# Patient Record
Sex: Female | Born: 1955
Health system: Southern US, Community
[De-identification: ages and names within clinical notes are randomized; demographics above are authoritative.]

## PROBLEM LIST (undated history)

## (undated) DIAGNOSIS — R519 Headache, unspecified: Secondary | ICD-10-CM

## (undated) DIAGNOSIS — G4700 Insomnia, unspecified: Secondary | ICD-10-CM

## (undated) DIAGNOSIS — R208 Other disturbances of skin sensation: Secondary | ICD-10-CM

## (undated) DIAGNOSIS — M1990 Unspecified osteoarthritis, unspecified site: Secondary | ICD-10-CM

## (undated) HISTORY — DX: Headache, unspecified: R51.9

## (undated) HISTORY — PX: APPENDECTOMY: SHX54

## (undated) HISTORY — DX: Other disturbances of skin sensation: R20.8

## (undated) HISTORY — DX: Insomnia, unspecified: G47.00

## (undated) HISTORY — DX: Unspecified osteoarthritis, unspecified site: M19.90

---

## 1998-05-06 ENCOUNTER — Ambulatory Visit (HOSPITAL_COMMUNITY): Admission: RE | Admit: 1998-05-06 | Discharge: 1998-05-06 | Payer: Self-pay | Admitting: Obstetrics & Gynecology

## 1998-05-08 ENCOUNTER — Ambulatory Visit (HOSPITAL_COMMUNITY): Admission: RE | Admit: 1998-05-08 | Discharge: 1998-05-08 | Payer: Self-pay | Admitting: Obstetrics and Gynecology

## 1998-09-24 ENCOUNTER — Inpatient Hospital Stay (HOSPITAL_COMMUNITY): Admission: EM | Admit: 1998-09-24 | Discharge: 1998-09-25 | Payer: Self-pay | Admitting: Emergency Medicine

## 1998-12-05 ENCOUNTER — Ambulatory Visit (HOSPITAL_COMMUNITY): Admission: RE | Admit: 1998-12-05 | Discharge: 1998-12-05 | Payer: Self-pay | Admitting: Obstetrics and Gynecology

## 1998-12-05 ENCOUNTER — Encounter: Payer: Self-pay | Admitting: Obstetrics and Gynecology

## 1999-03-11 ENCOUNTER — Other Ambulatory Visit: Admission: RE | Admit: 1999-03-11 | Discharge: 1999-03-11 | Payer: Self-pay | Admitting: Obstetrics and Gynecology

## 1999-05-22 ENCOUNTER — Encounter: Payer: Self-pay | Admitting: Obstetrics and Gynecology

## 1999-05-22 ENCOUNTER — Ambulatory Visit (HOSPITAL_COMMUNITY): Admission: RE | Admit: 1999-05-22 | Discharge: 1999-05-22 | Payer: Self-pay | Admitting: Obstetrics and Gynecology

## 2000-04-15 ENCOUNTER — Other Ambulatory Visit: Admission: RE | Admit: 2000-04-15 | Discharge: 2000-04-15 | Payer: Self-pay | Admitting: Obstetrics and Gynecology

## 2000-05-24 ENCOUNTER — Encounter: Admission: RE | Admit: 2000-05-24 | Discharge: 2000-05-24 | Payer: Self-pay | Admitting: Obstetrics and Gynecology

## 2000-05-24 ENCOUNTER — Encounter: Payer: Self-pay | Admitting: Obstetrics and Gynecology

## 2000-11-09 ENCOUNTER — Encounter: Payer: Self-pay | Admitting: Sports Medicine

## 2000-11-09 ENCOUNTER — Ambulatory Visit (HOSPITAL_COMMUNITY): Admission: RE | Admit: 2000-11-09 | Discharge: 2000-11-09 | Payer: Self-pay | Admitting: Sports Medicine

## 2001-06-22 ENCOUNTER — Ambulatory Visit (HOSPITAL_COMMUNITY): Admission: RE | Admit: 2001-06-22 | Discharge: 2001-06-22 | Payer: Self-pay | Admitting: Obstetrics and Gynecology

## 2001-06-22 ENCOUNTER — Encounter: Payer: Self-pay | Admitting: Obstetrics and Gynecology

## 2001-12-14 ENCOUNTER — Other Ambulatory Visit: Admission: RE | Admit: 2001-12-14 | Discharge: 2001-12-14 | Payer: Self-pay | Admitting: *Deleted

## 2002-01-03 ENCOUNTER — Encounter: Admission: RE | Admit: 2002-01-03 | Discharge: 2002-01-03 | Payer: Self-pay | Admitting: Obstetrics and Gynecology

## 2003-01-01 ENCOUNTER — Ambulatory Visit (HOSPITAL_COMMUNITY): Admission: RE | Admit: 2003-01-01 | Discharge: 2003-01-01 | Payer: Self-pay | Admitting: Family Medicine

## 2003-01-01 ENCOUNTER — Encounter: Payer: Self-pay | Admitting: Family Medicine

## 2003-05-19 ENCOUNTER — Emergency Department (HOSPITAL_COMMUNITY): Admission: EM | Admit: 2003-05-19 | Discharge: 2003-05-19 | Payer: Self-pay

## 2004-02-03 ENCOUNTER — Ambulatory Visit (HOSPITAL_COMMUNITY): Admission: RE | Admit: 2004-02-03 | Discharge: 2004-02-03 | Payer: Self-pay | Admitting: Family Medicine

## 2006-11-21 ENCOUNTER — Ambulatory Visit (HOSPITAL_COMMUNITY): Admission: RE | Admit: 2006-11-21 | Discharge: 2006-11-21 | Payer: Self-pay | Admitting: Family Medicine

## 2010-06-02 ENCOUNTER — Ambulatory Visit (HOSPITAL_COMMUNITY): Admission: RE | Admit: 2010-06-02 | Discharge: 2010-06-02 | Payer: Self-pay | Admitting: Gastroenterology

## 2010-09-18 ENCOUNTER — Ambulatory Visit (HOSPITAL_COMMUNITY)
Admission: RE | Admit: 2010-09-18 | Discharge: 2010-09-18 | Payer: Self-pay | Source: Home / Self Care | Attending: Family Medicine | Admitting: Family Medicine

## 2015-03-31 ENCOUNTER — Ambulatory Visit (HOSPITAL_COMMUNITY)
Admission: RE | Admit: 2015-03-31 | Discharge: 2015-03-31 | Disposition: A | Discharge status: Home / Self Care | Payer: 59 | Source: Ambulatory Visit | Attending: Family Medicine | Admitting: Family Medicine

## 2015-03-31 ENCOUNTER — Other Ambulatory Visit (HOSPITAL_COMMUNITY): Payer: Self-pay | Admitting: Family Medicine

## 2015-03-31 DIAGNOSIS — Z1231 Encounter for screening mammogram for malignant neoplasm of breast: Secondary | ICD-10-CM

## 2015-04-15 ENCOUNTER — Other Ambulatory Visit (HOSPITAL_COMMUNITY): Payer: Self-pay | Admitting: Family Medicine

## 2015-04-15 DIAGNOSIS — R109 Unspecified abdominal pain: Secondary | ICD-10-CM

## 2015-04-22 ENCOUNTER — Ambulatory Visit (HOSPITAL_COMMUNITY): Payer: 59

## 2015-11-24 DIAGNOSIS — H524 Presbyopia: Secondary | ICD-10-CM | POA: Diagnosis not present

## 2015-11-24 DIAGNOSIS — H04123 Dry eye syndrome of bilateral lacrimal glands: Secondary | ICD-10-CM | POA: Diagnosis not present

## 2015-11-24 DIAGNOSIS — H52222 Regular astigmatism, left eye: Secondary | ICD-10-CM | POA: Diagnosis not present

## 2015-11-24 DIAGNOSIS — H2513 Age-related nuclear cataract, bilateral: Secondary | ICD-10-CM | POA: Diagnosis not present

## 2015-12-06 DIAGNOSIS — M180 Bilateral primary osteoarthritis of first carpometacarpal joints: Secondary | ICD-10-CM | POA: Diagnosis not present

## 2015-12-06 DIAGNOSIS — N951 Menopausal and female climacteric states: Secondary | ICD-10-CM | POA: Diagnosis not present

## 2015-12-06 DIAGNOSIS — N952 Postmenopausal atrophic vaginitis: Secondary | ICD-10-CM | POA: Diagnosis not present

## 2015-12-06 DIAGNOSIS — F5101 Primary insomnia: Secondary | ICD-10-CM | POA: Diagnosis not present

## 2015-12-08 MED FILL — ESTRACE 0.01% CREAM: 0.1 | 90 days supply | Qty: 43 | Fill #0

## 2015-12-09 MED FILL — ZOLPIDEM TARTRATE 5 MG TAB: 5 | 30 days supply | Qty: 30 | Fill #0

## 2015-12-25 MED FILL — valACYclovir HCL 1 GM TABS: 1 | 3 days supply | Qty: 12 | Fill #0 | Status: TO

## 2016-01-23 MED FILL — ZOLPIDEM TARTRATE 5 MG TAB: 5 | 30 days supply | Qty: 30 | Fill #1

## 2016-02-20 MED FILL — ZOLPIDEM TARTRATE 5 MG TAB: 5 | 30 days supply | Qty: 30 | Fill #2

## 2016-03-17 DIAGNOSIS — L237 Allergic contact dermatitis due to plants, except food: Secondary | ICD-10-CM | POA: Diagnosis not present

## 2016-03-17 DIAGNOSIS — G44019 Episodic cluster headache, not intractable: Secondary | ICD-10-CM | POA: Diagnosis not present

## 2016-03-17 DIAGNOSIS — F5101 Primary insomnia: Secondary | ICD-10-CM | POA: Diagnosis not present

## 2016-03-17 MED FILL — BETAMETHASONE DP 0.05% CRM: 0.05 | 30 days supply | Qty: 45 | Fill #0

## 2016-03-17 MED FILL — AMITRIPTYLINE HCL 25 MG TAB: 25 | 30 days supply | Qty: 30 | Fill #0

## 2016-04-19 MED FILL — ZOLPIDEM TARTRATE 5 MG TAB: 5 | 30 days supply | Qty: 30 | Fill #3

## 2016-06-11 DIAGNOSIS — Z0000 Encounter for general adult medical examination without abnormal findings: Secondary | ICD-10-CM | POA: Diagnosis not present

## 2016-06-14 ENCOUNTER — Other Ambulatory Visit: Payer: Self-pay | Admitting: Family Medicine

## 2016-06-14 DIAGNOSIS — Z1231 Encounter for screening mammogram for malignant neoplasm of breast: Secondary | ICD-10-CM

## 2016-06-21 ENCOUNTER — Ambulatory Visit
Admission: RE | Admit: 2016-06-21 | Discharge: 2016-06-21 | Disposition: A | Discharge status: Home / Self Care | Payer: 59 | Source: Ambulatory Visit | Attending: Family Medicine | Admitting: Family Medicine

## 2016-06-21 DIAGNOSIS — Z1231 Encounter for screening mammogram for malignant neoplasm of breast: Secondary | ICD-10-CM

## 2017-01-19 DIAGNOSIS — H5201 Hypermetropia, right eye: Secondary | ICD-10-CM | POA: Diagnosis not present

## 2017-07-13 DIAGNOSIS — F5101 Primary insomnia: Secondary | ICD-10-CM | POA: Diagnosis not present

## 2017-07-19 MED FILL — ZOLPIDEM TARTRATE 5 MG TAB: 5 | 30 days supply | Qty: 30 | Fill #0

## 2017-07-20 DIAGNOSIS — M9902 Segmental and somatic dysfunction of thoracic region: Secondary | ICD-10-CM | POA: Diagnosis not present

## 2017-07-20 DIAGNOSIS — M9901 Segmental and somatic dysfunction of cervical region: Secondary | ICD-10-CM | POA: Diagnosis not present

## 2017-07-20 DIAGNOSIS — M9904 Segmental and somatic dysfunction of sacral region: Secondary | ICD-10-CM | POA: Diagnosis not present

## 2017-07-20 DIAGNOSIS — M542 Cervicalgia: Secondary | ICD-10-CM | POA: Diagnosis not present

## 2017-10-03 MED FILL — ZOLPIDEM TARTRATE 5 MG TAB: 5 | 30 days supply | Qty: 30 | Fill #1

## 2017-11-03 ENCOUNTER — Other Ambulatory Visit: Payer: Self-pay | Admitting: Occupational Medicine

## 2017-11-03 ENCOUNTER — Ambulatory Visit: Payer: Self-pay

## 2017-11-03 DIAGNOSIS — R0781 Pleurodynia: Secondary | ICD-10-CM

## 2017-11-28 MED FILL — ZOLPIDEM TARTRATE 5 MG TAB: 5 | 30 days supply | Qty: 30 | Fill #2

## 2017-12-22 ENCOUNTER — Other Ambulatory Visit (HOSPITAL_BASED_OUTPATIENT_CLINIC_OR_DEPARTMENT_OTHER): Payer: Self-pay | Admitting: Family Medicine

## 2017-12-22 DIAGNOSIS — Z13820 Encounter for screening for osteoporosis: Secondary | ICD-10-CM

## 2017-12-30 ENCOUNTER — Other Ambulatory Visit (HOSPITAL_BASED_OUTPATIENT_CLINIC_OR_DEPARTMENT_OTHER): Payer: Self-pay

## 2018-01-02 ENCOUNTER — Other Ambulatory Visit (HOSPITAL_BASED_OUTPATIENT_CLINIC_OR_DEPARTMENT_OTHER): Payer: Self-pay

## 2018-01-04 DIAGNOSIS — Z0000 Encounter for general adult medical examination without abnormal findings: Secondary | ICD-10-CM | POA: Diagnosis not present

## 2018-01-11 ENCOUNTER — Other Ambulatory Visit (HOSPITAL_BASED_OUTPATIENT_CLINIC_OR_DEPARTMENT_OTHER): Payer: Self-pay | Admitting: Family Medicine

## 2018-01-11 ENCOUNTER — Encounter (INDEPENDENT_AMBULATORY_CARE_PROVIDER_SITE_OTHER): Payer: Self-pay

## 2018-01-11 ENCOUNTER — Ambulatory Visit (HOSPITAL_BASED_OUTPATIENT_CLINIC_OR_DEPARTMENT_OTHER)
Admission: RE | Admit: 2018-01-11 | Discharge: 2018-01-11 | Disposition: A | Discharge status: Home / Self Care | Payer: 59 | Source: Ambulatory Visit | Attending: Family Medicine | Admitting: Family Medicine

## 2018-01-11 DIAGNOSIS — Z1231 Encounter for screening mammogram for malignant neoplasm of breast: Secondary | ICD-10-CM

## 2018-01-11 DIAGNOSIS — M8588 Other specified disorders of bone density and structure, other site: Secondary | ICD-10-CM | POA: Diagnosis not present

## 2018-01-11 DIAGNOSIS — Z1159 Encounter for screening for other viral diseases: Secondary | ICD-10-CM | POA: Diagnosis not present

## 2018-01-11 DIAGNOSIS — M8589 Other specified disorders of bone density and structure, multiple sites: Secondary | ICD-10-CM | POA: Diagnosis not present

## 2018-01-11 DIAGNOSIS — Z13820 Encounter for screening for osteoporosis: Secondary | ICD-10-CM | POA: Insufficient documentation

## 2018-01-11 DIAGNOSIS — N951 Menopausal and female climacteric states: Secondary | ICD-10-CM | POA: Diagnosis not present

## 2018-01-11 DIAGNOSIS — F5101 Primary insomnia: Secondary | ICD-10-CM | POA: Diagnosis not present

## 2018-01-11 DIAGNOSIS — Z780 Asymptomatic menopausal state: Secondary | ICD-10-CM | POA: Diagnosis not present

## 2018-01-11 DIAGNOSIS — Z0000 Encounter for general adult medical examination without abnormal findings: Secondary | ICD-10-CM | POA: Diagnosis not present

## 2018-01-13 DIAGNOSIS — Z1159 Encounter for screening for other viral diseases: Secondary | ICD-10-CM | POA: Diagnosis not present

## 2018-01-23 MED FILL — ZOLPIDEM TARTRATE 5 MG TAB: 5 | 30 days supply | Qty: 30 | Fill #0

## 2018-01-23 MED FILL — ESTRADIOL 0.1 MG/GM CRM: 0.1 | 90 days supply | Qty: 43 | Fill #0

## 2018-03-01 MED FILL — ZOLPIDEM TARTRATE 5 MG TAB: 5 | 30 days supply | Qty: 30 | Fill #1

## 2018-03-30 DIAGNOSIS — E860 Dehydration: Secondary | ICD-10-CM | POA: Diagnosis not present

## 2018-03-30 DIAGNOSIS — K529 Noninfective gastroenteritis and colitis, unspecified: Secondary | ICD-10-CM | POA: Diagnosis not present

## 2018-03-30 DIAGNOSIS — R1900 Intra-abdominal and pelvic swelling, mass and lump, unspecified site: Secondary | ICD-10-CM | POA: Diagnosis not present

## 2018-03-31 ENCOUNTER — Other Ambulatory Visit (HOSPITAL_BASED_OUTPATIENT_CLINIC_OR_DEPARTMENT_OTHER): Payer: Self-pay | Admitting: Family Medicine

## 2018-03-31 DIAGNOSIS — R1900 Intra-abdominal and pelvic swelling, mass and lump, unspecified site: Secondary | ICD-10-CM

## 2018-04-02 ENCOUNTER — Ambulatory Visit (HOSPITAL_BASED_OUTPATIENT_CLINIC_OR_DEPARTMENT_OTHER)
Admission: RE | Admit: 2018-04-02 | Discharge: 2018-04-02 | Disposition: A | Discharge status: Home / Self Care | Payer: 59 | Source: Ambulatory Visit | Attending: Family Medicine | Admitting: Family Medicine

## 2018-04-02 DIAGNOSIS — R1900 Intra-abdominal and pelvic swelling, mass and lump, unspecified site: Secondary | ICD-10-CM

## 2018-04-07 DIAGNOSIS — L308 Other specified dermatitis: Secondary | ICD-10-CM | POA: Diagnosis not present

## 2018-04-07 DIAGNOSIS — L509 Urticaria, unspecified: Secondary | ICD-10-CM | POA: Diagnosis not present

## 2018-04-17 MED FILL — ZOLPIDEM TARTRATE 5 MG TAB: 5 | 30 days supply | Qty: 30 | Fill #2

## 2018-06-12 MED FILL — ZOLPIDEM TARTRATE 5 MG TAB: 5 | 30 days supply | Qty: 30 | Fill #3

## 2018-08-21 DIAGNOSIS — F5101 Primary insomnia: Secondary | ICD-10-CM | POA: Diagnosis not present

## 2018-08-21 DIAGNOSIS — J069 Acute upper respiratory infection, unspecified: Secondary | ICD-10-CM | POA: Diagnosis not present

## 2018-08-21 MED FILL — ZOLPIDEM TARTRATE 5 MG TAB: 5 | 30 days supply | Qty: 30 | Fill #0

## 2018-08-21 MED FILL — NAPROXEN 500 MG TABLET: 500 | 10 days supply | Qty: 20 | Fill #0

## 2018-08-21 MED FILL — BENZONATATE 100 MG CAPS: 100 | 7 days supply | Qty: 20 | Fill #0

## 2018-09-20 DIAGNOSIS — H5201 Hypermetropia, right eye: Secondary | ICD-10-CM | POA: Diagnosis not present

## 2018-09-20 DIAGNOSIS — H524 Presbyopia: Secondary | ICD-10-CM | POA: Diagnosis not present

## 2018-09-20 DIAGNOSIS — H2513 Age-related nuclear cataract, bilateral: Secondary | ICD-10-CM | POA: Diagnosis not present

## 2018-11-22 MED FILL — ZOLPIDEM TARTRATE 5 MG TAB: 5 | 30 days supply | Qty: 30 | Fill #2 | Status: TO

## 2018-12-04 MED FILL — ESTRADIOL 0.1 MG/GM CREA: 0.1 | 90 days supply | Qty: 43 | Fill #1

## 2019-01-10 MED FILL — ZOLPIDEM TARTRATE 5 MG TAB: 5 | 30 days supply | Qty: 30 | Fill #0

## 2019-01-16 MED FILL — NAPROXEN 500 MG TABLET: 500 | 10 days supply | Qty: 20 | Fill #0

## 2019-03-30 DIAGNOSIS — L638 Other alopecia areata: Secondary | ICD-10-CM | POA: Diagnosis not present

## 2019-03-30 MED FILL — BETAMETHASONE DP 0.05% LOT: 0.05 | 120 days supply | Qty: 120 | Fill #0

## 2019-04-23 DIAGNOSIS — F5101 Primary insomnia: Secondary | ICD-10-CM | POA: Diagnosis not present

## 2019-04-23 DIAGNOSIS — N951 Menopausal and female climacteric states: Secondary | ICD-10-CM | POA: Diagnosis not present

## 2019-04-24 MED FILL — ZOLPIDEM TARTRATE 5 MG TAB: 5 | 30 days supply | Qty: 30 | Fill #0

## 2019-04-24 MED FILL — ESTRADIOL 0.1 MG/GM CREA: 0.1 | 90 days supply | Qty: 43 | Fill #0

## 2019-07-05 MED FILL — ZOLPIDEM TARTRATE 5 MG TAB: 5 | 30 days supply | Qty: 30 | Fill #1

## 2019-08-18 DIAGNOSIS — J329 Chronic sinusitis, unspecified: Secondary | ICD-10-CM | POA: Diagnosis not present

## 2019-08-31 MED FILL — ZOLPIDEM TARTRATE 5 MG TAB: 5 | 30 days supply | Qty: 30 | Fill #2

## 2019-10-09 MED FILL — ZOLPIDEM TARTRATE 5 MG TAB: 5 | 30 days supply | Qty: 30 | Fill #3

## 2019-12-17 MED FILL — ZOLPIDEM TARTRATE 5 MG TAB: 5 | 30 days supply | Qty: 30 | Fill #0

## 2019-12-27 DIAGNOSIS — F5101 Primary insomnia: Secondary | ICD-10-CM | POA: Diagnosis not present

## 2019-12-27 DIAGNOSIS — N951 Menopausal and female climacteric states: Secondary | ICD-10-CM | POA: Diagnosis not present

## 2019-12-27 DIAGNOSIS — Z1231 Encounter for screening mammogram for malignant neoplasm of breast: Secondary | ICD-10-CM | POA: Diagnosis not present

## 2019-12-27 DIAGNOSIS — Z0000 Encounter for general adult medical examination without abnormal findings: Secondary | ICD-10-CM | POA: Diagnosis not present

## 2019-12-27 MED FILL — ESTRADIOL 0.1 MG/GM CREA: 0.1 | 90 days supply | Qty: 43 | Fill #0

## 2019-12-27 MED FILL — CITALOPRAM HBR 20 MG TABLET: 20 | 30 days supply | Qty: 30 | Fill #0

## 2019-12-27 MED FILL — NAPROXEN 500 MG TABLET: 500 | 10 days supply | Qty: 20 | Fill #0

## 2019-12-31 DIAGNOSIS — Z0000 Encounter for general adult medical examination without abnormal findings: Secondary | ICD-10-CM | POA: Diagnosis not present

## 2019-12-31 DIAGNOSIS — E538 Deficiency of other specified B group vitamins: Secondary | ICD-10-CM | POA: Diagnosis not present

## 2019-12-31 DIAGNOSIS — N951 Menopausal and female climacteric states: Secondary | ICD-10-CM | POA: Diagnosis not present

## 2020-01-02 DIAGNOSIS — R208 Other disturbances of skin sensation: Secondary | ICD-10-CM | POA: Diagnosis not present

## 2020-01-02 MED FILL — valACYclovir HCL 1 GM TABS: 1 | 7 days supply | Qty: 21 | Fill #0

## 2020-01-02 MED FILL — GABAPENTIN 100 MG CAPSULE: 100 | 7 days supply | Qty: 21 | Fill #0

## 2020-01-08 ENCOUNTER — Encounter: Payer: Self-pay | Admitting: Neurology

## 2020-01-08 ENCOUNTER — Ambulatory Visit: Payer: 59 | Admitting: Neurology

## 2020-01-08 ENCOUNTER — Other Ambulatory Visit: Payer: Self-pay | Admitting: Neurology

## 2020-01-08 ENCOUNTER — Other Ambulatory Visit: Payer: Self-pay | Admitting: Orthopedic Surgery

## 2020-01-08 ENCOUNTER — Other Ambulatory Visit: Payer: Self-pay

## 2020-01-08 VITALS — BP 124/88 | HR 85 | Temp 96.4°F | Ht 62.0 in | Wt 105.0 lb

## 2020-01-08 DIAGNOSIS — R202 Paresthesia of skin: Secondary | ICD-10-CM

## 2020-01-08 DIAGNOSIS — G35 Multiple sclerosis: Secondary | ICD-10-CM

## 2020-01-08 MED ORDER — DICLOFENAC SODIUM 1 % EX GEL: 4.0000 g | Freq: Four times a day (QID) | CUTANEOUS | 11 refills | Status: DC | PRN

## 2020-01-08 MED ORDER — LIDOCAINE-PRILOCAINE 2.5-2.5 % EX CREA: 1.0000 "application " | TOPICAL_CREAM | Freq: Four times a day (QID) | CUTANEOUS | 11 refills | Status: DC | PRN

## 2020-01-08 MED FILL — DICLOFENAC SODIUM 1 % GEL: 1 | 7 days supply | Qty: 100 | Fill #0

## 2020-01-08 MED FILL — LIDOCAINE-PRILOCAINE CREAM: 2.5-2.5 | 14 days supply | Qty: 30 | Fill #0

## 2020-01-08 NOTE — Progress Notes (Signed)
PATIENT: Destiny Dunlap DOB: Feb 09, 1956  Chief Complaint  Patient presents with  . Dysesthesia    She is here with her husband, Richard. She has a pending MRI brain on 01/14/20. Reports on 12/28/19, she developed a burning sensation in her right antecubital area. Symptoms have now spread to various areas of her body (left arm, behind knees, shoulder blades, jaw line, hands). She is taking gabapentin 100mg  one capsule TID (started on 01/05/20). No improvement in symptoms yet.   Marland Kitchen Headache    She has headaches at least once every three weeks. She uses Excedrin Migraine helps. About twice per year, she misses work with a headache severe enough to cause vomiting and requires rest in a dark room.   . PCP    Leota Jacobsen, MD     HISTORICAL  NERINE STURMS is a 64 year old female, seen in request by primary care physician Dr. Andria Rhein, Mortimer Fries accompanied by her husband evaluation for right arm paresthesia  I have reviewed and summarized the referring note from the referring physician.  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, denies arm weakness, she was also able to take care of her grandchildren age of 27 months, 63 years old on January 01, 2020, denies difficulty holding them,  She was seen by her primary care physician on January 02, 2020, was treated with gabapentin 100 mg 3 times a day, the medicine initially was helping her, but lost its benefit quickly, she also complains of significant dry mouth even with low-dose  In addition, her right arm paresthesia has progressed to burning pain, 9 out of 10, constant, also spreading to involving left arm, bilateral calf region  If she is distracted, stayed in cold environment such as OR, she is able to perform her job, is  become more noticeable the other time  She denies gait abnormality, no significant neck, low back pain, she denied rash broke out  MRI brain was ordered already Laboratory evaluation at [facility] March 2021, hemoglobin globin 14.6, CMP, vitamin D was 27, negative hepatitis C,  REVIEW OF SYSTEMS: Full 14 system review of systems performed and notable only for as above All other review of systems were negative.  ALLERGIES: Allergies  Allergen Reactions  . Iodine Rash    HOME MEDICATIONS: Current Outpatient Medications  Medication Sig Dispense Refill  . aspirin-acetaminophen-caffeine (EXCEDRIN MIGRAINE) 250-250-65 MG tablet Take 1 tablet by mouth as needed for headache.    . citalopram (CELEXA) 20 MG tablet Take 20 mg by mouth daily.    Marland Kitchen estradiol (ESTRACE) 0.1 MG/GM vaginal cream Place 1 g vaginally 3 (three) times a week.    . gabapentin (NEURONTIN) 100 MG capsule Take 100 mg by mouth 3 (three) times daily.    . TURMERIC PO Take 300 mg by mouth daily.    Marland Kitchen VITAMIN D PO Take 10,000 Units by mouth daily.    Marland Kitchen zolpidem (AMBIEN) 5 MG tablet Take 2.5 mg by mouth at bedtime as needed for sleep. Estimates taking four times weekly.     No current facility-administered medications for this visit.    PAST MEDICAL HISTORY: Past Medical History:  Diagnosis Date  . Arthritis   . Burning sensation   .  Frequent headaches   . Insomnia     PAST SURGICAL HISTORY: Past Surgical History:  Procedure Laterality Date  . APPENDECTOMY      FAMILY HISTORY: Family History  Problem Relation Age of Onset  . Heart disease Mother   . Parkinson's disease Father   . CAD Father   . Thrombocytopenia Father   . Seizures Father        2 grand mal seizures    SOCIAL HISTORY: Social History   Socioeconomic History  . Marital status: Married    Spouse name: Not on file  . Number of children: 2  . Years of education: college  . Highest education level: Not on file  Occupational History   . Not on file  Tobacco Use  . Smoking status: Never Smoker  . Smokeless tobacco: Never Used  Substance and Sexual Activity  . Alcohol use: Never  . Drug use: Never  . Sexual activity: Not on file  Other Topics Concern  . Not on file  Social History Narrative   Lives at home with her husband.   One cup caffeine per day.   Right-handed.   Social Determinants of Health   Financial Resource Strain:   . Difficulty of Paying Living Expenses:   Food Insecurity:   . Worried About Charity fundraiser in the Last Year:   . Arboriculturist in the Last Year:   Transportation Needs:   . Film/video editor (Medical):   Marland Kitchen Lack of Transportation (Non-Medical):   Physical Activity:   . Days of Exercise per Week:   . Minutes of Exercise per Session:   Stress:   . Feeling of Stress :   Social Connections:   . Frequency of Communication with Friends and Family:   . Frequency of Social Gatherings with Friends and Family:   . Attends Religious Services:   . Active Member of Clubs or Organizations:   . Attends Archivist Meetings:   Marland Kitchen Marital Status:   Intimate Partner Violence:   . Fear of Current or Ex-Partner:   . Emotionally Abused:   Marland Kitchen Physically Abused:   . Sexually Abused:      PHYSICAL EXAM   Vitals:   01/08/20 1447  BP: 124/88  Pulse: 85  Temp: (!) 96.4 F (35.8 C)  Weight: 105 lb (47.6 kg)  Height: 5\' 2"  (1.575 m)    Not recorded      Body mass index is 19.2 kg/m.  PHYSICAL EXAMNIATION:  Gen: NAD, conversant, well nourised, well groomed                     Cardiovascular: Regular rate rhythm, no peripheral edema, warm, nontender. Eyes: Conjunctivae clear without exudates or hemorrhage Neck: Supple, no carotid bruits. Pulmonary: Clear to auscultation bilaterally   NEUROLOGICAL EXAM:  MENTAL STATUS: Speech:    Speech is normal; fluent and spontaneous with normal comprehension.  Cognition:     Orientation to time, place and person      Normal recent and remote memory     Normal Attention span and concentration     Normal Language, naming, repeating,spontaneous speech     Fund of knowledge   CRANIAL NERVES: CN II: Visual fields are full to confrontation. Pupils are round equal and briskly reactive to light. CN III, IV, VI: extraocular movement are normal. No ptosis. CN V: Facial sensation is intact to light touch CN VII: Face is symmetric with normal eye closure  CN VIII: Hearing is normal to causal conversation. CN IX, X: Phonation is normal. CN XI: Head turning and shoulder shrug are intact  MOTOR: There is no pronator drift of out-stretched arms. Muscle bulk and tone are normal. Muscle strength is normal.  REFLEXES: Reflexes are 2+ and symmetric at the biceps, triceps, knees, and ankles. Plantar responses are flexor.  SENSORY: Varies, likely sensitivity of right antecubital area  COORDINATION: There is no trunk or limb dysmetria noted.  GAIT/STANCE: Posture is normal. Gait is steady with normal steps, base, arm swing, and turning. Heel and toe walking are normal. Tandem gait is normal.  Romberg is absent.   DIAGNOSTIC DATA (LABS, IMAGING, TESTING) - I reviewed patient records, labs, notes, testing and imaging myself where available.   ASSESSMENT AND PLAN  YMELDA SAMONTE is a 64 y.o. female   Right arm paresthesia since December 28, 2019, also involving left arm, bilateral lower extremity now  Essentially normal neurological examination with exception of mild hypersensitivity of the right antecubital region  She desires complete evaluation, will also add on MRI of cervical spine to rule out cervical myelopathy  EMG nerve conduction study to rule out right upper extremity neuropathy  Laboratory evaluations,  EMLA cream with diclofenac gel as needed   Marcial Pacas, M.D. Ph.D.  [facility] [address] [place], [place] [address] Ph: [phone] Fax: [phone]  CC:  Leota Jacobsen, MD

## 2020-01-09 ENCOUNTER — Telehealth: Payer: Self-pay | Admitting: Neurology

## 2020-01-09 LAB — VARICELLA ZOSTER ABS, IGG/IGM
Varicella IgM: <0.91 index (ref 0.00–0.90)
Varicella zoster IgG: 404 index (ref >165)

## 2020-01-09 LAB — SEDIMENTATION RATE: Sed Rate: 2 mm/hr (ref 0–40)

## 2020-01-09 LAB — ANA W/REFLEX IF POSITIVE: Anti Nuclear Antibody (ANA): NEGATIVE

## 2020-01-09 LAB — C-REACTIVE PROTEIN: CRP: <1 mg/L (ref 0–10)

## 2020-01-09 NOTE — Telephone Encounter (Signed)
Pt has called to report that her symptoms are worsening and pt would like to request for blood to be tested for lead poisoning, pt is asking if the blood from yesterday could be used.

## 2020-01-09 NOTE — Telephone Encounter (Signed)
Pt has called back to report that she would like her blood checked for Carbon monoxide because she fell asleep near gas logs.  Pt states she feels worse today, pt asked that it be noted she told her husband if she continues to feel bad she will likely go back to the ED.  Pt was told message is being sent to Sharyn Lull, [place] and that she will have Dr Krista Blue to address the patient will wait for a response.

## 2020-01-09 NOTE — Telephone Encounter (Signed)
The diagnosis of CO poisoning is based upon a compatible history and physical examination in conjunction with an elevated carboxyhemoglobin level measured by cooximetry of an arterial blood gas sample. In hemodynamically stable patients, venous samples are accurate and commonly used [46,47]. Nonsmokers may have up to 3 percent carboxyhemoglobin at baseline; smokers may have levels of 10 to 15 percent. Levels above these respective values are consistent with CO poisoning.  A carboxyhemoglobin measurement is essential for determining exposure, but levels correlate imprecisely with the degree of poisoning and are not predictive of delayed neurologic sequelae (DNS). Patient symptoms and signs guide management, not carboxyhemoglobin levels.  Many patients can be managed in the emergency department, as most symptoms resolve with high-flow oxygen. Patients whose symptoms do not resolve, who demonstrate electrocardiogram (ECG) or laboratory evidence of severe poisoning, or who have other medical or social cause for concern should be hospitalized.   Many acute care hospitals lack the ability to measure carboxyhemoglobin concentrations.  Patients suspected of CO poisoning who are treated in hospitals without on-site cooximetry should be treated with 100 percent oxygen via nonrebreathing face mask.  Please call patient, our office does not not have the ability to test the carbon monoxide poisoning, if she is suspicious for carbon monoxide poisoning, she should go to emergency room as soon as possible

## 2020-01-09 NOTE — Telephone Encounter (Signed)
Late entry. I spoke to the patient and her husband earlier. They both sit next to the same gas logs. He says they have a carbon monoxide alarm system that is in working order. I informed them of Dr. Rhea Belton response below about going to the ED for evaluation. She stated she did not believe this was the issue and that she was just searching for possible answers. Stated that she researched the symptoms of CO poison and does not have any of them. She denies any nausea, vomiting or dizziness. He denies feeling bad at all.  Dr. Krista Blue was willing to add heavy metals to her lab work. However, [facility] is unable to add it to the blood collected yesterday. She would have to come back again. She declined the offer and said she plans to see her PCP for the orders.

## 2020-01-10 ENCOUNTER — Telehealth: Payer: Self-pay | Admitting: Neurology

## 2020-01-10 DIAGNOSIS — R5383 Other fatigue: Secondary | ICD-10-CM | POA: Diagnosis not present

## 2020-01-10 DIAGNOSIS — Z79899 Other long term (current) drug therapy: Secondary | ICD-10-CM | POA: Diagnosis not present

## 2020-01-10 DIAGNOSIS — M542 Cervicalgia: Secondary | ICD-10-CM | POA: Diagnosis not present

## 2020-01-10 DIAGNOSIS — R208 Other disturbances of skin sensation: Secondary | ICD-10-CM | POA: Diagnosis not present

## 2020-01-10 DIAGNOSIS — R634 Abnormal weight loss: Secondary | ICD-10-CM | POA: Diagnosis not present

## 2020-01-10 DIAGNOSIS — R000 Tachycardia, unspecified: Secondary | ICD-10-CM | POA: Diagnosis not present

## 2020-01-10 DIAGNOSIS — R531 Weakness: Secondary | ICD-10-CM | POA: Diagnosis not present

## 2020-01-10 DIAGNOSIS — Z20822 Contact with and (suspected) exposure to covid-19: Secondary | ICD-10-CM | POA: Diagnosis not present

## 2020-01-10 DIAGNOSIS — M545 Low back pain: Secondary | ICD-10-CM | POA: Diagnosis not present

## 2020-01-10 NOTE — Telephone Encounter (Signed)
[facility] Auth: [place] Ref # [id number]. I spoke to the patient to schedule her MRI Cervical spine. She stated she doesn't think she needs the c-spine done and that she is checking in at [facility] ER right now because she is worsening daily. She stated she is hoping to get some answers today at [facility].

## 2020-01-11 ENCOUNTER — Telehealth: Payer: Self-pay | Admitting: Neurology

## 2020-01-11 ENCOUNTER — Other Ambulatory Visit (HOSPITAL_COMMUNITY): Payer: Self-pay

## 2020-01-11 NOTE — Telephone Encounter (Signed)
Pt called wanting to know if her MRI of the brain can be changed to an MRI of the Cervical Spine. Please advise.

## 2020-01-11 NOTE — Telephone Encounter (Signed)
Returned the call to the patient. Dr. Krista Blue placed an order for MRI cervical the day of her visit. Provided our office number and requested she call Destiny Dunlap back during our regular office hours. She will be happy to help get this scheduled.  MRI brain was ordered by Dr. Altamese Shoreham.

## 2020-01-14 ENCOUNTER — Ambulatory Visit (HOSPITAL_COMMUNITY)
Admission: RE | Admit: 2020-01-14 | Discharge: 2020-01-14 | Disposition: A | Discharge status: Home / Self Care | Payer: 59 | Source: Ambulatory Visit | Attending: Orthopedic Surgery | Admitting: Orthopedic Surgery

## 2020-01-14 ENCOUNTER — Other Ambulatory Visit: Payer: Self-pay

## 2020-01-14 DIAGNOSIS — G35 Multiple sclerosis: Secondary | ICD-10-CM | POA: Insufficient documentation

## 2020-01-14 MED ORDER — GADOBUTROL 1 MMOL/ML IV SOLN
4.7000 mL | Freq: Once | INTRAVENOUS | Status: AC | PRN
  Administered 2020-01-14: 12:00:00 4.7 mL via INTRAVENOUS

## 2020-01-14 NOTE — Telephone Encounter (Signed)
no to the covid questions MR Cervical spine wo contrast Dr. Edmund Hilda [facility] Auth: [place] Ref # [id number]. The patient is scheduled at [facility] for 01/23/20.

## 2020-01-14 NOTE — Telephone Encounter (Signed)
no to the covid questions MR Cervical spine wo contrast Dr. Edmund Hilda [facility] Auth: [place] Ref # [id number]. The patient is scheduled at [facility] for 01/23/20.

## 2020-01-15 ENCOUNTER — Telehealth: Payer: Self-pay | Admitting: Neurology

## 2020-01-15 NOTE — Telephone Encounter (Signed)
She completed MRI brain on 01/14/20.

## 2020-01-15 NOTE — Telephone Encounter (Signed)
I reviewed the results below. She would like an additional call from Dr. Krista Blue to discuss in further detail. She is still worried that she has MS, despite the findings on the scan.

## 2020-01-15 NOTE — Telephone Encounter (Addendum)
IMPRESSION: Nonspecific scattered left frontal white matter lesions.  No enhancing lesions or restricted diffusion.  Normal bilateral optic nerves.  I have called patient, I have personally reviewed MRI of the brain with without contrast from January 14, 2020, minimum scattered T2/flair hyperintensity periventricular and subcortical white man matter foci involving left frontal lobe, this can be age-related changes,  Essentially no significant abnormality on MRI of the brain, no evidence of MS.  MRI cervical is pending for April 21.

## 2020-01-15 NOTE — Telephone Encounter (Signed)
Pt called wanting to discuss her MRI results. Please advise. 

## 2020-01-15 NOTE — Telephone Encounter (Signed)
Message from patient:  Destiny Dunlap, Destiny Dunlap "[name]"  Marcial Pacas, MD 12 minutes ago (9:42 AM)   Please call me ASAP to explain the MRI results from yesterday. Thanks [phone]

## 2020-01-16 DIAGNOSIS — R208 Other disturbances of skin sensation: Secondary | ICD-10-CM | POA: Diagnosis not present

## 2020-01-16 DIAGNOSIS — E559 Vitamin D deficiency, unspecified: Secondary | ICD-10-CM | POA: Diagnosis not present

## 2020-01-17 MED FILL — GABAPENTIN 100 MG CAPSULE: 100 | 30 days supply | Qty: 180 | Fill #0

## 2020-01-18 DIAGNOSIS — E538 Deficiency of other specified B group vitamins: Secondary | ICD-10-CM | POA: Diagnosis not present

## 2020-01-18 DIAGNOSIS — R202 Paresthesia of skin: Secondary | ICD-10-CM | POA: Diagnosis not present

## 2020-01-21 MED FILL — LIDOCAINE-PRILOCAINE CREAM: 2.5-2.5 | 14 days supply | Qty: 30 | Fill #1

## 2020-01-23 ENCOUNTER — Ambulatory Visit: Payer: 59

## 2020-01-23 ENCOUNTER — Telehealth: Payer: Self-pay | Admitting: Neurology

## 2020-01-23 DIAGNOSIS — R202 Paresthesia of skin: Secondary | ICD-10-CM | POA: Diagnosis not present

## 2020-01-23 NOTE — Telephone Encounter (Signed)
I called patient today regarding rescheduling 5/3 NCV/EMG due to tech being out. Patient states she did not wish to reschedule test until she talks with her pcp to see if she needs it or if she can put it off.

## 2020-01-24 ENCOUNTER — Telehealth: Payer: Self-pay | Admitting: Neurology

## 2020-01-24 NOTE — Telephone Encounter (Signed)
I have spoken with the patient and provided her with the results below. She was scheduled on 02/04/20 but our office canceled due to Elmyra Ricks being out of the office that day. She would like to call back to reschedule when she has her calendar available.

## 2020-01-24 NOTE — Telephone Encounter (Signed)
IMPRESSION:   MRI cervical spine (without) demonstrating:  - At C3-4: disc bulging and uncovertebral joint hypertrophy with severe right foraminal stenosis. - At C5-6, C6-7: uncovertebral joint hypertrophy with mild right foraminal stenosis. - No intrinsic or compressive spinal cord lesions.   Please call patient, MRI of cervical spine showed evidence of degenerative changes, most severe at C3-4, with disc bulging, and uncovertebral joint hypertrophy, with severe right foraminal stenosis, no evidence or cord signal change, or cord compression,  I have ordered EMG nerve conduction study during previous visit, if she wish to proceed, please give her appointment

## 2020-02-04 ENCOUNTER — Encounter: Payer: 59 | Admitting: Neurology

## 2020-02-07 ENCOUNTER — Telehealth: Payer: Self-pay | Admitting: Neurology

## 2020-02-07 NOTE — Telephone Encounter (Signed)
I called the patient had a long convo and advised we would call her if a EMG spot came available before the 19th.

## 2020-02-07 NOTE — Telephone Encounter (Signed)
Patient called requesting a cb from [place] did not wish to disclose in regards to what.

## 2020-02-07 NOTE — Telephone Encounter (Signed)
I called patient and left a voice message for her to return my call

## 2020-02-11 NOTE — Telephone Encounter (Signed)
I called pt. Spoke to [place], husband, per [employer]. I offered pt an appt on 02/13/20 at 10:30/11:15am for NCV/EMG testing. Richard accepted this appt. Pt's husband verbalized understanding of new appt date and time and that the appt on 02/20/20 will be cancelled.

## 2020-02-11 NOTE — Telephone Encounter (Signed)
There currently no sooner appts for EMG/NCV testing with Dr. Krista Blue. I will continue to watch for cancellations.

## 2020-02-13 ENCOUNTER — Ambulatory Visit: Payer: 59 | Admitting: Neurology

## 2020-02-13 ENCOUNTER — Telehealth: Payer: Self-pay | Admitting: Neurology

## 2020-02-13 ENCOUNTER — Other Ambulatory Visit: Payer: Self-pay

## 2020-02-13 ENCOUNTER — Ambulatory Visit (INDEPENDENT_AMBULATORY_CARE_PROVIDER_SITE_OTHER): Payer: 59 | Admitting: Neurology

## 2020-02-13 DIAGNOSIS — R202 Paresthesia of skin: Secondary | ICD-10-CM | POA: Diagnosis not present

## 2020-02-13 DIAGNOSIS — R52 Pain, unspecified: Secondary | ICD-10-CM | POA: Diagnosis not present

## 2020-02-13 MED ORDER — DULOXETINE HCL 20 MG PO CPEP: 20.0000 mg | ORAL_CAPSULE | Freq: Every day | ORAL | 3 refills | Status: DC

## 2020-02-13 NOTE — Telephone Encounter (Signed)
Pt called stating that her pharmacy informed her that she should stop her DULoxetine (CYMBALTA) 20 MG capsule and to call the office. Pt would like to speak to RN about this. Please advise.

## 2020-02-13 NOTE — Progress Notes (Signed)
PATIENT: Destiny Dunlap DOB: [birth year]  No chief complaint on file.    HISTORICAL  Destiny Dunlap is a 64 year old female, seen in request by primary care physician Dr. Andria Rhein, Mortimer Fries accompanied by her husband evaluation for right arm paresthesia  I have reviewed and summarized the referring note from the referring physician.  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, denies arm weakness, she was also able to take care of her grandchildren age of 38 months, 55 years old on January 01, 2020, denies difficulty holding them,  She was seen by her primary care physician on January 02, 2020, was treated with gabapentin 100 mg 3 times a day, the medicine initially was helping her, but lost its benefit quickly, she also complains of significant dry mouth even with low-dose  In addition, her right arm paresthesia has progressed to burning pain, 9 out of 10, constant, also spreading to involving left arm, bilateral calf region  If she is distracted, stayed in cold environment such as OR, she is able to perform her job, is become more noticeable the other time  She denies gait abnormality, no significant neck, low back pain, she denied rash broke out  MRI brain was ordered already Laboratory evaluation at [facility] March 2021, hemoglobin globin 14.6, CMP, vitamin D was 27, negative hepatitis C,  UPDATE Feb 13 2020: Multiple phone conversation since her initial visit on January 08, 2020, she continue complains of significant right antecubital fossa burning pain, also become more generalized, diffuse body achy pain  I reviewed laboratory evaluation from [facility] on January 16, 2020, vitamin D level was mildly decreased 27, vitamin B12 220, she has started  over-the-counter B12, vitamin D supplement, laboratory evaluation also showed normal ESR, C-reactive protein, negative ANA, positive varicella-zoster IgG antibody as expected with vaccination  We personally reviewed MRI of cervical spine in April 2021, multilevel degenerative disease, most noticeable at C3-C4, with uncovertebral joint hypertrophy, severe right foraminal stenosis.  There is no evidence of spinal cord compression.  MRI of the brain with without contrast showed nonspecific left frontal scoliosis, no evidence of MS  Today she came in with constellation of complaints, despite taking Celexa 20 mg daily, gabapentin up to 600 mg daily, diffuse body achy pain, which seems to improved overall after B12, vitamin supplement, but has a significant flareup last night, complains of 9 out of 10 diffuse body achy pain, could not sleep well, after taking gabapentin, Ambien, but she denies persistent sensory loss, there is no limitation in her motor activity.  She return for electrodiagnostic study today, which was normal, there is no evidence of right upper or lower extremity neuropathy, no evidence of intrinsic muscle disease. REVIEW OF SYSTEMS: Full 14 system review of systems performed and notable only for as above All other review of systems were negative.  ALLERGIES: Allergies  Allergen Reactions  . Iodine Rash    HOME MEDICATIONS: Current Outpatient Medications  Medication Sig Dispense Refill  . aspirin-acetaminophen-caffeine (EXCEDRIN MIGRAINE) 250-250-65 MG tablet Take 1 tablet by mouth as needed for headache.    . citalopram (CELEXA) 20 MG tablet Take 20 mg by mouth daily.    . diclofenac Sodium (VOLTAREN) 1 % GEL Apply 4 g  topically 4 (four) times daily as needed. 100 g 11  . DULoxetine (CYMBALTA) 20 MG capsule Take 1 capsule (20 mg total) by mouth daily. 30 capsule 3  . estradiol (ESTRACE) 0.1 MG/GM vaginal cream Place 1 g vaginally 3 (three) times a week.    . gabapentin  (NEURONTIN) 100 MG capsule Take 100 mg by mouth 3 (three) times daily.    Marland Kitchen lidocaine-prilocaine (EMLA) cream Apply 1 application topically 4 (four) times daily as needed. 30 g 11  . TURMERIC PO Take 300 mg by mouth daily.    Marland Kitchen VITAMIN D PO Take 10,000 Units by mouth daily.    Marland Kitchen zolpidem (AMBIEN) 5 MG tablet Take 2.5 mg by mouth at bedtime as needed for sleep. Estimates taking four times weekly.     No current facility-administered medications for this visit.    PAST MEDICAL HISTORY: Past Medical History:  Diagnosis Date  . Arthritis   . Burning sensation   . Frequent headaches   . Insomnia     PAST SURGICAL HISTORY: Past Surgical History:  Procedure Laterality Date  . APPENDECTOMY      FAMILY HISTORY: Family History  Problem Relation Age of Onset  . Heart disease Mother   . Parkinson's disease Father   . CAD Father   . Thrombocytopenia Father   . Seizures Father        2 grand mal seizures    SOCIAL HISTORY: Social History   Socioeconomic History  . Marital status: Married    Spouse name: Not on file  . Number of children: 2  . Years of education: college  . Highest education level: Not on file  Occupational History  . Not on file  Tobacco Use  . Smoking status: Never Smoker  . Smokeless tobacco: Never Used  Substance and Sexual Activity  . Alcohol use: Never  . Drug use: Never  . Sexual activity: Not on file  Other Topics Concern  . Not on file  Social History Narrative   Lives at home with her husband.   One cup caffeine per day.   Right-handed.   Social Determinants of Health   Financial Resource Strain:   . Difficulty of Paying Living Expenses:   Food Insecurity:   . Worried About Charity fundraiser in the Last Year:   . Arboriculturist in the Last Year:   Transportation Needs:   . Film/video editor (Medical):   Marland Kitchen Lack of Transportation (Non-Medical):   Physical Activity:   . Days of Exercise per Week:   . Minutes of Exercise per  Session:   Stress:   . Feeling of Stress :   Social Connections:   . Frequency of Communication with Friends and Family:   . Frequency of Social Gatherings with Friends and Family:   . Attends Religious Services:   . Active Member of Clubs or Organizations:   . Attends Archivist Meetings:   Marland Kitchen Marital Status:   Intimate Partner Violence:   . Fear of Current or Ex-Partner:   . Emotionally Abused:   Marland Kitchen Physically Abused:   . Sexually Abused:      PHYSICAL EXAM   There were no vitals filed for this visit.  Not recorded      There is no height or weight on file to calculate BMI.  PHYSICAL EXAMNIATION:  Gen: NAD, conversant, well nourised, well groomed  NEUROLOGICAL EXAM:  MENTAL STATUS: Speech:    Speech is normal; fluent  and spontaneous with normal comprehension.  Cognition:     Orientation to time, place and person     Normal recent and remote memory     Normal Attention span and concentration     Normal Language, naming, repeating,spontaneous speech     Fund of knowledge   CRANIAL NERVES: CN II: Visual fields are full to confrontation. Pupils are round equal and briskly reactive to light. CN III, IV, VI: extraocular movement are normal. No ptosis. CN V: Facial sensation is intact to light touch CN VII: Face is symmetric with normal eye closure  CN VIII: Hearing is normal to causal conversation. CN IX, X: Phonation is normal. CN XI: Head turning and shoulder shrug are intact  MOTOR: There is no pronator drift of out-stretched arms. Muscle bulk and tone are normal. Muscle strength is normal.  REFLEXES: Reflexes are 2+ and symmetric at the biceps, triceps, knees, and ankles. Plantar responses are flexor.  SENSORY: Varies, likely sensitivity of right antecubital area  COORDINATION: There is no trunk or limb dysmetria noted.  GAIT/STANCE: Posture is normal. Gait is steady with normal steps, base, arm swing, and turning. Heel and toe walking are  normal. Tandem gait is normal.  Romberg is absent.   DIAGNOSTIC DATA (LABS, IMAGING, TESTING) - I reviewed patient records, labs, notes, testing and imaging myself where available.   ASSESSMENT AND PLAN  Destiny Dunlap is a 64 y.o. female   Constellation of complaints  Including right arm severe burning pain, diffuse body achy pain,  Normal neurological examination, electrodiagnostic study, no significant abnormality on MRI of the brain,  Mild cervical degenerative disc disease, most noticeable at C3-4 level, with moderate severe right foraminal narrowing, which would not explain her widespread constellation of complaints,  She does complains of rapid weight loss, insomnia, anxiety symptoms, mild improvement with Celexa 20 mg daily,  We will add on Cymbalta 20 mg daily  Continue follow-up with her primary care physician, she is already scheduled to see [facility] neurologist  Will only return to clinic for new issues  Marcial Pacas, M.D. Ph.D.  [facility] [address], [place], [place] [address] Ph: [phone] Fax: [phone]  CC: Leota Jacobsen, MD

## 2020-02-13 NOTE — Procedures (Signed)
Full Name: Destiny [facility]lap [facility] Gender: Female MRN #: [id number] Date of Birth: [birth year]    Visit Date: 02/13/2020 10:54 Age: 64 Years Examining Physician: Marcial Pacas, MD  Referring Physician: Marcial Pacas, MD Height: 5 feet 2 inch History:    64 year old female complains sudden onset right anterior cubital fossa significant burning pain, later generalized to intermittent whole body achy pain. On examination: Bilateral upper and lower extremity motor strength is normal, sensory examination was intact to light touch, pinprick, vibratory sensation.  Deep tendon reflex were normal and symmetric.  Gait was normal,  Summary of the tests:  Nerve conduction study:  Right sural, superficial peroneal, ulnar sensory responses were normal.  Right median motor response showed mildly prolonged peak latency, right median mixed response was 0.6 ms prolonged compared to ipsilateral ulnar mixed response.  Right tibial, peroneal to EDB, ulnar, median motor responses were normal.  Electromyography: Selected needle examination of right upper, lower extremity muscles, right cervical and lumbosacral paraspinal muscles were normal.  Conclusion: This is a normal study.  There is no electrodiagnostic evidence of right cervical radiculopathy, large fiber peripheral neuropathy, or intrinsic muscle disease.  There is evidence of mild right median neuropathy across the wrist, consistent with mild right carpal tunnel syndrome.    ------------------------------- Marcial Pacas, M.D. PhD  [facility] [address], [place] [address] Tel: [phone] Fax: [phone]  Verbal informed consent was obtained from the patient, patient was informed of potential risk of procedure, including bruising, bleeding, hematoma formation, infection, muscle weakness, muscle pain, numbness, among others.         El [place]    Nerve / Sites Muscle Latency Ref. Amplitude Ref. Rel Amp Segments Distance  Velocity Ref. Area    ms ms mV mV %  cm m/s m/s mVms  R Median - APB     Wrist APB 4.1 ?4.4 8.2 ?4.0 100 Wrist - APB 7   30.9     Upper arm APB 7.8  7.5  92.4 Upper arm - Wrist 20 55 ?49 29.9  R Ulnar - ADM     Wrist ADM 2.8 ?3.3 16.2 ?6.0 100 Wrist - ADM 7   57.2     B.Elbow ADM 5.7  14.0  86.5 B.Elbow - Wrist 19 66 ?49 50.9     A.Elbow ADM 7.4  12.8  91.2 A.Elbow - B.Elbow 10 60 ?49 48.0         A.Elbow - Wrist      R Peroneal - EDB     Ankle EDB 5.9 ?6.5 4.2 ?2.0 100 Ankle - EDB 9   15.9     Fib head EDB 11.0  4.1  98 Fib head - Ankle 25 49 ?44 15.8     Pop fossa EDB 13.2  4.2  103 Pop fossa - Fib head 10 46 ?44 15.5         Pop fossa - Ankle      R Tibial - AH     Ankle AH 4.0 ?5.8 14.1 ?4.0 100 Ankle - AH 9   35.8     Pop fossa AH 11.5  11.7  83 Pop fossa - Ankle 35 47 ?41 32.3             SNC    Nerve / Sites Rec. Site Peak Lat Ref.  Amp Ref. Segments Distance Peak Diff Ref.    ms ms V V  cm ms ms  R Sural -  Ankle (Calf)     Calf Ankle 3.8 ?4.4 14 ?6 Calf - Ankle 14    R Superficial peroneal - Ankle     Lat leg Ankle 4.2 ?4.4 8 ?6 Lat leg - Ankle 14    R Median, Ulnar - Transcarpal comparison     Median Palm Wrist 2.8 ?2.2 88 ?35 Median Palm - Wrist 8       Ulnar Palm Wrist 2.2 ?2.2 68 ?12 Ulnar Palm - Wrist 8          Median Palm - Ulnar Palm  0.6 ?0.4  R Median - Orthodromic (Dig II, Mid palm)     Dig II Wrist 3.8 ?3.4 15 ?10 Dig II - Wrist 13    R Ulnar - Orthodromic, (Dig V, Mid palm)     Dig V Wrist 2.8 ?3.1 12 ?5 Dig V - Wrist 11    R Lateral antebrachial cutaneous - Forearm (Elbow)     Elbow Forearm 1.5 ?3.0 32 ?10 Elbow - Forearm 12    R Medial antebrachial cutaneous - Forearm (Elbow)     Elbow Forearm 1.4 ?3.2 16 ?5 Elbow - Forearm 12                     F  Wave    Nerve F Lat Ref.   ms ms  R Ulnar - ADM 24.8 ?32.0  R Tibial - AH 40.4 ?56.0         EMG Summary Table    Spontaneous MUAP Recruitment  Muscle IA Fib PSW Fasc Other Amp Dur. Poly Pattern    R. Tibialis anterior Normal None None None _______ Normal Normal Normal Normal  R. Tibialis posterior Normal None None None _______ Normal Normal Normal Normal  R. Peroneus longus Normal None None None _______ Normal Normal Normal Normal  R. Gastrocnemius (Medial head) Normal None None None _______ Normal Normal Normal Normal  R. Vastus lateralis Normal None None None _______ Normal Normal Normal Normal  R. Lumbar paraspinals (mid) Normal None None None _______ Normal Normal Normal Normal  R. Lumbar paraspinals (low) Normal None None None _______ Normal Normal Normal Normal  R. First dorsal interosseous Normal None None None _______ Normal Normal Normal Normal  R. Pronator teres Normal None None None _______ Normal Normal Normal Normal  R. Biceps brachii Normal None None None _______ Normal Normal Normal Normal  R. Deltoid Normal None None None _______ Normal Normal Normal Normal  R. Triceps brachii Normal None None None _______ Normal Normal Normal Normal  R. Cervical paraspinals Normal None None None _______ Normal Normal Normal Normal  R. Brachioradialis Normal None None None _______ Normal Normal Normal Normal

## 2020-02-13 NOTE — Telephone Encounter (Signed)
  Can you call this patient, to mail or arrange her to pick up her MRI cervical CD, it is done in our mobile unit

## 2020-02-14 NOTE — Telephone Encounter (Signed)
Pt called requesting a CB or a detailed message to be left or mychart message as she is currently in surgery

## 2020-02-14 NOTE — Telephone Encounter (Signed)
Sent my chart message to the pt regarding this message.

## 2020-02-14 NOTE — Telephone Encounter (Signed)
I called pt. No answer, left a message asking pt to call me back.   

## 2020-02-19 ENCOUNTER — Encounter: Payer: Self-pay | Admitting: Neurology

## 2020-02-20 ENCOUNTER — Encounter: Payer: 59 | Admitting: Neurology

## 2020-02-28 DIAGNOSIS — K2900 Acute gastritis without bleeding: Secondary | ICD-10-CM | POA: Diagnosis not present

## 2020-02-28 DIAGNOSIS — R202 Paresthesia of skin: Secondary | ICD-10-CM | POA: Diagnosis not present

## 2020-02-28 DIAGNOSIS — E538 Deficiency of other specified B group vitamins: Secondary | ICD-10-CM | POA: Diagnosis not present

## 2020-02-28 MED FILL — FAMOTIDINE 20 MG TABS: 20 | 30 days supply | Qty: 60 | Fill #0

## 2020-03-06 MED FILL — GABAPENTIN 100 MG CAPSULE: 100 | 30 days supply | Qty: 180 | Fill #1

## 2020-03-14 ENCOUNTER — Ambulatory Visit (HOSPITAL_BASED_OUTPATIENT_CLINIC_OR_DEPARTMENT_OTHER)
Admission: RE | Admit: 2020-03-14 | Discharge: 2020-03-14 | Disposition: A | Discharge status: Home / Self Care | Payer: 59 | Source: Ambulatory Visit | Attending: Family Medicine | Admitting: Family Medicine

## 2020-03-14 ENCOUNTER — Other Ambulatory Visit: Payer: Self-pay

## 2020-03-14 ENCOUNTER — Other Ambulatory Visit (HOSPITAL_BASED_OUTPATIENT_CLINIC_OR_DEPARTMENT_OTHER): Payer: Self-pay | Admitting: Family Medicine

## 2020-03-14 DIAGNOSIS — Z1231 Encounter for screening mammogram for malignant neoplasm of breast: Secondary | ICD-10-CM | POA: Insufficient documentation

## 2020-03-20 MED FILL — LIDOCAINE-PRILOCAINE CREAM: 2.5-2.5 | 14 days supply | Qty: 30 | Fill #3

## 2020-03-20 MED FILL — ZOLPIDEM TARTRATE 5 MG TAB: 5 | 30 days supply | Qty: 30 | Fill #0

## 2020-04-17 MED FILL — LIDOCAINE-PRILOCAINE CREAM: 2.5-2.5 | 14 days supply | Qty: 30 | Fill #4

## 2020-04-24 DIAGNOSIS — H33192 Other retinoschisis and retinal cysts, left eye: Secondary | ICD-10-CM | POA: Diagnosis not present

## 2020-04-28 DIAGNOSIS — L638 Other alopecia areata: Secondary | ICD-10-CM | POA: Diagnosis not present

## 2020-04-28 DIAGNOSIS — L820 Inflamed seborrheic keratosis: Secondary | ICD-10-CM | POA: Diagnosis not present

## 2020-04-28 DIAGNOSIS — L821 Other seborrheic keratosis: Secondary | ICD-10-CM | POA: Diagnosis not present

## 2020-04-28 DIAGNOSIS — R208 Other disturbances of skin sensation: Secondary | ICD-10-CM | POA: Diagnosis not present

## 2020-05-01 ENCOUNTER — Encounter (INDEPENDENT_AMBULATORY_CARE_PROVIDER_SITE_OTHER): Payer: 59 | Admitting: Ophthalmology

## 2020-05-05 MED FILL — ZOLPIDEM TARTRATE 5 MG TAB: 5 | 30 days supply | Qty: 30 | Fill #1

## 2020-05-05 MED FILL — GABAPENTIN 100 MG CAPSULE: 100 | 30 days supply | Qty: 180 | Fill #2

## 2020-05-07 ENCOUNTER — Encounter (INDEPENDENT_AMBULATORY_CARE_PROVIDER_SITE_OTHER): Payer: Self-pay | Admitting: Ophthalmology

## 2020-05-07 ENCOUNTER — Ambulatory Visit (INDEPENDENT_AMBULATORY_CARE_PROVIDER_SITE_OTHER): Payer: 59 | Admitting: Ophthalmology

## 2020-05-07 ENCOUNTER — Other Ambulatory Visit: Payer: Self-pay

## 2020-05-07 DIAGNOSIS — H43813 Vitreous degeneration, bilateral: Secondary | ICD-10-CM | POA: Insufficient documentation

## 2020-05-07 NOTE — Progress Notes (Signed)
05/07/2020     CHIEF COMPLAINT Patient presents for Retina Evaluation   HISTORY OF PRESENT ILLNESS: Destiny Dunlap is a 64 y.o. female who presents to the clinic today for:   HPI    Retina Evaluation    In left eye.  This started 1 year ago.  Duration of 1 year.  Context:  near vision and reading.          Comments    Retinoschisis OS - Ref'd by Dr. [place]  Pt reports decline in near [place] OU x 1 year. Pt denies flashes, floaters, or veil over [place] OU. Pt denies ocular pain OU. 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  No visual acuity complaints as only having reading vision difficulties.  She uses contact lenses for her near vision needs in the operating room         Last edited by Hurman Horn, MD on 05/07/2020 10:24 AM. (History)      Referring physician: [place] [id number] [place],  [place] [address]  HISTORICAL INFORMATION:   Selected notes from the [facility]: No current outpatient medications on file. (Ophthalmic Drugs)   No current facility-administered medications for this visit. (Ophthalmic Drugs)   Current Outpatient Medications (Other)  Medication Sig  . aspirin-acetaminophen-caffeine (EXCEDRIN MIGRAINE) 250-250-65 MG tablet Take 1 tablet by mouth as needed for headache.  . citalopram (CELEXA) 20 MG tablet Take 20 mg by mouth daily.  . diclofenac Sodium (VOLTAREN) 1 % GEL Apply 4 g topically 4 (four) times daily as needed.  . DULoxetine (CYMBALTA) 20 MG capsule Take 1 capsule (20 mg total) by mouth daily.  Marland Kitchen estradiol (ESTRACE) 0.1 MG/GM vaginal cream Place 1 g vaginally 3 (three) times a week.  . gabapentin (NEURONTIN) 100 MG capsule Take 100 mg by mouth 3 (three) times daily.  Marland Kitchen lidocaine-prilocaine (EMLA) cream Apply 1 application topically 4 (four) times daily as needed.  . TURMERIC PO Take 300 mg by mouth daily.  Marland Kitchen VITAMIN D PO Take 10,000 Units by mouth daily.  Marland Kitchen zolpidem  (AMBIEN) 5 MG tablet Take 2.5 mg by mouth at bedtime as needed for sleep. Estimates taking four times weekly.   No current facility-administered medications for this visit. (Other)      REVIEW OF SYSTEMS:    ALLERGIES Allergies  Allergen Reactions  . Cymbalta [Duloxetine Hcl] Itching  . Iodine Rash    PAST MEDICAL HISTORY Past Medical History:  Diagnosis Date  . Arthritis   . Burning sensation   . Frequent headaches   . Insomnia    Past Surgical History:  Procedure Laterality Date  . APPENDECTOMY      FAMILY HISTORY Family History  Problem Relation Age of Onset  . Heart disease Mother   . Parkinson's disease Father   . CAD Father   . Thrombocytopenia Father   . Seizures Father        2 grand mal seizures    SOCIAL HISTORY Social History   Tobacco Use  . Smoking status: Never Smoker  . Smokeless tobacco: Never Used  Substance Use Topics  . Alcohol use: Never  . Drug use: Never         OPHTHALMIC EXAM:  Base Eye Exam    Visual Acuity (ETDRS)      Right Left   Dist [place] 20/20 -1 20/20 -1       Tonometry (  Tonopen, 9:09 AM)      Right Left   Pressure 14 15       Pupils      Pupils Dark Light Shape React APD   Right PERRL 4 3 Round Brisk None   Left PERRL 4 3 Round Brisk None       Visual Fields (Counting fingers)      Left Right    Full Full       Extraocular Movement      Right Left    Full Full       Neuro/Psych    Oriented x3: Yes   Mood/Affect: Normal       Dilation    Both eyes: 1.0% Mydriacyl, 2.5% Phenylephrine @ 9:09 AM        Additional Tests    Color      Right Left    No red desaturation No red desaturation        Slit Lamp and Fundus Exam    External Exam      Right Left   External Normal Normal       Slit Lamp Exam      Right Left   Lids/Lashes Normal Normal   Conjunctiva/Sclera White and quiet White and quiet   Cornea Clear Clear   Anterior Chamber Deep and quiet Deep and quiet   Iris Round and  reactive Round and reactive   Anterior Vitreous Normal Normal       Fundus Exam      Right Left   Posterior Vitreous Posterior vitreous detachment Posterior vitreous detachment   Disc Normal Normal   C/D Ratio .25 0.25   Macula Normal Normal   Vessels Normal Normal   Periphery Scleral Depression No holes, tears or schisis Scleral Depression No holes, tears or schisis, Pigmentation @ 6 o'clock, 46M lens, 90D, 20D exams          IMAGING AND PROCEDURES  Imaging and Procedures for 05/07/20  OCT, Retina - OU - Both Eyes       Right Eye Quality was good. Scan locations included subfoveal. Central Foveal Thickness: 282. Progression has no prior data. Findings include normal observations, normal foveal contour.   Left Eye Quality was good. Scan locations included subfoveal. Central Foveal Thickness: 307. Progression has no prior data. Findings include normal observations, normal foveal contour.   Notes Bilateral PVD, clear media, no active disease       Color Fundus Photography Optos - OU - Both Eyes       Right Eye Disc findings include normal observations. Macula : normal observations. Vessels : normal observations. Periphery : normal observations.   Left Eye Progression has no prior data. Disc findings include normal observations. Macula : normal observations. Vessels : normal observations. Periphery : normal observations.                 ASSESSMENT/PLAN:  Posterior vitreous detachment of both eyes   The nature of posterior vitreous detachment was discussed with the patient as well as its physiology, its age prevalence, and its possible implication regarding retinal breaks and detachment.  An informational brochure was given to the patient.  All the patient's questions were answered.  The patient was asked to return if new or different flashes or floaters develops.   Patient was instructed to contact office immediately if any changes were noticed. I explained to the  patient that vitreous inside the eye is similar to jello inside a bowl. As the jello  melts it can start to pull away from the bowl, similarly the vitreous throughout our lives can begin to pull away from the retina. That process is called a posterior vitreous detachment. In some cases, the vitreous can tug hard enough on the retina to form a retinal tear. I discussed with the patient the signs and symptoms of a retinal detachment.  Do not rub the eye.OU, no signs of retinal hole, tear is certainly no retinoschisis in either eye.      ICD-10-CM   1. Posterior vitreous detachment of both eyes  H43.813 OCT, Retina - OU - Both Eyes    Color Fundus Photography Optos - OU - Both Eyes    1.  Reassured the patient that there is no retinal schisis, no peripheral retinal pathology of any concern.  Moreover I have reassured her that with a family history of macular degeneration there are currently no findings that suggest that she is even a mild case in either eye. 2.  I reassured the patient as well that her asymptomatic eyes, even with the alleged finding, will not be related to her other potential neurologic issues that are present  3.  I recommend patient follow-up with Dr. Heather [place] as scheduled in July of this year.  Anytime in the future.  Ophthalmic Meds Ordered this visit:  No orders of the defined types were placed in this encounter.      Return if symptoms worsen or fail to improve, for Heather [place] as scheduled.  There are no Patient Instructions on file for this visit.   Explained the diagnoses, plan, and follow up with the patient and they expressed understanding.  Patient expressed understanding of the importance of proper follow up care.   Clent Demark Algenis Ballin M.D. Diseases & Surgery of the Retina and Vitreous [facility] [place] 05/07/20     Abbreviations: M myopia (nearsighted); A astigmatism; H hyperopia (farsighted); P presbyopia; Mrx spectacle prescription;  CTL  contact lenses; OD right eye; OS left eye; OU both eyes  XT exotropia; ET esotropia; PEK punctate epithelial keratitis; PEE punctate epithelial erosions; DES dry eye syndrome; MGD meibomian gland dysfunction; ATs artificial tears; PFAT's preservative free artificial tears; [place] nuclear sclerotic cataract; PSC posterior subcapsular cataract; ERM epi-retinal membrane; PVD posterior vitreous detachment; RD retinal detachment; DM diabetes mellitus; DR diabetic retinopathy; NPDR non-proliferative diabetic retinopathy; PDR proliferative diabetic retinopathy; CSME clinically significant macular edema; DME diabetic macular edema; dbh dot blot hemorrhages; CWS cotton wool spot; POAG primary open angle glaucoma; C/D cup-to-disc ratio; HVF humphrey visual field; GVF goldmann visual field; OCT optical coherence tomography; IOP intraocular pressure; BRVO Branch retinal vein occlusion; CRVO central retinal vein occlusion; CRAO central retinal artery occlusion; BRAO branch retinal artery occlusion; RT retinal tear; SB scleral buckle; PPV pars plana vitrectomy; VH Vitreous hemorrhage; PRP panretinal laser photocoagulation; IVK intravitreal kenalog; VMT vitreomacular traction; MH Macular hole;  NVD neovascularization of the disc; NVE neovascularization elsewhere; AREDS age related eye disease study; ARMD age related macular degeneration; POAG primary open angle glaucoma; EBMD epithelial/anterior basement membrane dystrophy; ACIOL anterior chamber intraocular lens; IOL intraocular lens; PCIOL posterior chamber intraocular lens; Phaco/IOL phacoemulsification with intraocular lens placement; [place] photorefractive keratectomy; LASIK laser assisted in situ keratomileusis; HTN hypertension; DM diabetes mellitus; COPD chronic obstructive pulmonary disease

## 2020-05-07 NOTE — Assessment & Plan Note (Addendum)
The nature of posterior vitreous detachment was discussed with the patient as well as its physiology, its age prevalence, and its possible implication regarding retinal breaks and detachment.  An informational brochure was given to the patient.  All the patient's questions were answered.  The patient was asked to return if new or different flashes or floaters develops.   Patient was instructed to contact office immediately if any changes were noticed. I explained to the patient that vitreous inside the eye is similar to jello inside a bowl. As the jello melts it can start to pull away from the bowl, similarly the vitreous throughout our lives can begin to pull away from the retina. That process is called a posterior vitreous detachment. In some cases, the vitreous can tug hard enough on the retina to form a retinal tear. I discussed with the patient the signs and symptoms of a retinal detachment.  Do not rub the eye.OU, no signs of retinal hole, tear is certainly no retinoschisis in either eye.

## 2020-07-14 ENCOUNTER — Other Ambulatory Visit: Payer: Self-pay

## 2020-07-14 ENCOUNTER — Ambulatory Visit: Payer: 59 | Admitting: Allergy and Immunology

## 2020-07-14 ENCOUNTER — Encounter: Payer: Self-pay | Admitting: Allergy and Immunology

## 2020-07-14 VITALS — BP 110/70 | HR 62 | Resp 16 | Ht 63.0 in | Wt 107.0 lb

## 2020-07-14 DIAGNOSIS — R202 Paresthesia of skin: Secondary | ICD-10-CM | POA: Diagnosis not present

## 2020-07-14 NOTE — Progress Notes (Signed)
[facility] [place] [facility] [place] - [place] - [place]   Dear Dr. Andria Rhein,  Thank you for referring Destiny Dunlap to the [facility] on 07/14/2020.   Below is a summation of this patient's evaluation and recommendations.  Thank you for your referral. I will keep you informed about this patient's response to treatment.   If you have any questions please do not hesitate to contact me.   Sincerely,  Jiles Prows, MD Allergy / Immunology [place]   ______________________________________________________________________    NEW PATIENT NOTE  Referring Provider: Leota Jacobsen, MD Primary Provider: Leota Jacobsen, MD Date of office visit: 07/14/2020    Subjective:   Chief Complaint:  Destiny Dunlap (DOB: 1956-01-23) is a 64 y.o. female who presents to the clinic on 07/14/2020 with a chief complaint of Allergy Testing .     HPI: Destiny Dunlap presents to this clinic in evaluation of paresthesia.  Since March 2021 she has developed a waxing and waning pattern of skin burning initially affecting her right arm but progressing to involve her shoulders and jawline and neck and legs.  She feels as though she has a severe sunburn.  She has no associated systemic or constitutional symptoms.  Evaluation has included nerve conduction studies and an MRI of her C-spine and brain as directed by neurology which has not led to the diagnosis of a specific syndrome.  She is considering going to [facility] for a second opinion.  Interestingly, she had a 3-week interval of time starting 4 weeks ago where her paresthesia completely resolved.  It returned this past week and it is not as severe as it was initially.  It is currently involving her arms and her posterior legs.  Therapy has included the administration of gabapentin and it does sound as though gabapentin may give her some improvement when she  utilizes this drug.  She only utilizes gabapentin when she has significant symptoms.  She does not take any medications in general and she has a etiologic issue with using medications on a regular basis.  She has received 2 Pfizer Covid vaccines with her last vaccine in January 2021.  Past Medical History:  Diagnosis Date   Arthritis    Burning sensation    Frequent headaches    Insomnia     Past Surgical History:  Procedure Laterality Date   APPENDECTOMY      Allergies as of 07/14/2020      Reactions   Cymbalta [duloxetine Hcl] Itching   Iodine Rash      Medication List    aspirin-acetaminophen-caffeine 431-540-08 MG tablet Commonly known as: EXCEDRIN MIGRAINE Take 1 tablet by mouth as needed for headache.   gabapentin 100 MG capsule Commonly known as: NEURONTIN Take 100 mg by mouth 3 (three) times daily.   lidocaine-prilocaine cream Commonly known as: EMLA Apply 1 application topically 4 (four) times daily as needed.   TURMERIC PO Take 300 mg by mouth daily.   VITAMIN D PO Take 10,000 Units by mouth daily.   zolpidem 5 MG tablet Commonly known as: AMBIEN Take 2.5 mg by mouth at bedtime as needed for sleep. Estimates taking four times weekly.       Review of systems negative except as noted in HPI / PMHx or noted below:  Review of Systems  Constitutional: Negative.   HENT: Negative.   Eyes: Negative.   Respiratory: Negative.  Cardiovascular: Negative.   Gastrointestinal: Negative.   Genitourinary: Negative.   Musculoskeletal: Negative.   Skin: Negative.   Neurological: Negative.   Endo/Heme/Allergies: Negative.   Psychiatric/Behavioral: Negative.     Family History  Problem Relation Age of Onset   Heart disease Mother    Parkinson's disease Father    CAD Father    Thrombocytopenia Father    Seizures Father        2 grand mal seizures    Social History   Socioeconomic History   Marital status: Married    Spouse name: Not on  file   Number of children: 2   Years of education: college   Highest education level: Not on file  Occupational History   Not on file  Tobacco Use   Smoking status: Never Smoker   Smokeless tobacco: Never Used  Substance and Sexual Activity   Alcohol use: Never   Drug use: Never   Sexual activity: Not on file  Other Topics Concern   Not on file  Social History Narrative   Lives at home with her husband.   One cup caffeine per day.   Right-handed.    Environmental and Social history  Lives in a house with a dry environment, no animals look inside the household, carpet in the bedroom, plastic on the bed, plastic on the pillow, no smoking ongoing inside the household.  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  Objective:   Vitals:   07/14/20 0919  BP: 110/70  Pulse: 62  Resp: 16  SpO2: 98%   Height: 5\' 3"  (160 cm) Weight: 107 lb (48.5 kg)  Physical Exam Constitutional:      Appearance: She is not diaphoretic.  HENT:     Head: Normocephalic.     Right Ear: Tympanic membrane, ear canal and external ear normal.     Left Ear: Tympanic membrane, ear canal and external ear normal.     Nose: Nose normal. No mucosal edema or rhinorrhea.     Mouth/Throat:     Pharynx: Uvula midline. No oropharyngeal exudate.  Eyes:     Conjunctiva/sclera: Conjunctivae normal.  Neck:     Thyroid: No thyromegaly.     Trachea: Trachea normal. No tracheal tenderness or tracheal deviation.  Cardiovascular:     Rate and Rhythm: Normal rate and regular rhythm.     Heart sounds: Normal heart sounds, S1 normal and S2 normal. No murmur heard.   Pulmonary:     Effort: No respiratory distress.     Breath sounds: Normal breath sounds. No stridor. No wheezing or rales.  Lymphadenopathy:     Head:     Right side of head: No tonsillar adenopathy.     Left side of head: No tonsillar adenopathy.     Cervical: No cervical adenopathy.  Skin:    Findings: No erythema or  rash.     Nails: There is no clubbing.  Neurological:     Mental Status: She is alert.     Diagnostics: Allergy skin tests were performed.  She did not demonstrate any hypersensitivity against a screening panel of aeroallergens or foods.  Assessment and Plan:    1. 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  I think it is a very good sign that she had a 3-week interval recently without any paresthesia and hopefully this process is burning out.  I did inform her that she may want to consider using gabapentin on a regular basis aiming for the least amount of this medication required to make her paresthesia more tolerable and hopefully not precipitate any side effects.  Jiles Prows, MD Allergy / Immunology [facility]

## 2020-07-15 ENCOUNTER — Encounter: Payer: Self-pay | Admitting: Allergy and Immunology

## 2020-07-15 ENCOUNTER — Encounter: Payer: Self-pay | Admitting: *Deleted

## 2020-07-23 DIAGNOSIS — Z20822 Contact with and (suspected) exposure to covid-19: Secondary | ICD-10-CM | POA: Diagnosis not present

## 2020-07-23 DIAGNOSIS — Z03818 Encounter for observation for suspected exposure to other biological agents ruled out: Secondary | ICD-10-CM | POA: Diagnosis not present

## 2020-07-26 DIAGNOSIS — Z20822 Contact with and (suspected) exposure to covid-19: Secondary | ICD-10-CM | POA: Diagnosis not present

## 2020-07-28 ENCOUNTER — Other Ambulatory Visit (HOSPITAL_COMMUNITY): Payer: Self-pay | Admitting: Family Medicine

## 2020-07-28 MED FILL — ZOLPIDEM TARTRATE 5 MG TAB: 5 | 30 days supply | Qty: 30 | Fill #0

## 2020-08-07 DIAGNOSIS — G609 Hereditary and idiopathic neuropathy, unspecified: Secondary | ICD-10-CM | POA: Diagnosis not present

## 2020-08-07 DIAGNOSIS — G629 Polyneuropathy, unspecified: Secondary | ICD-10-CM | POA: Diagnosis not present

## 2020-08-07 DIAGNOSIS — G6289 Other specified polyneuropathies: Secondary | ICD-10-CM | POA: Diagnosis not present

## 2020-08-15 ENCOUNTER — Other Ambulatory Visit (HOSPITAL_COMMUNITY): Payer: Self-pay | Admitting: Internal Medicine

## 2020-08-15 MED FILL — FLUARIX QUADRIVALENT 0.5 ML: 0.5 | 1 days supply | Qty: 1 | Fill #0

## 2020-09-12 MED FILL — GABAPENTIN 100 MG CAPSULE: 100 | 30 days supply | Qty: 180 | Fill #3

## 2020-09-16 MED FILL — ZOLPIDEM TARTRATE 5 MG TAB: 5 | 30 days supply | Qty: 30 | Fill #1

## 2020-10-14 ENCOUNTER — Ambulatory Visit: Payer: 59

## 2020-11-18 ENCOUNTER — Other Ambulatory Visit (HOSPITAL_COMMUNITY): Payer: Self-pay | Admitting: Family Medicine

## 2020-11-18 MED FILL — ZOLPIDEM TARTRATE 5 MG TAB: 5 | 30 days supply | Qty: 30 | Fill #0

## 2020-11-21 DIAGNOSIS — Z79899 Other long term (current) drug therapy: Secondary | ICD-10-CM | POA: Diagnosis not present

## 2020-11-21 DIAGNOSIS — Z0000 Encounter for general adult medical examination without abnormal findings: Secondary | ICD-10-CM | POA: Diagnosis not present

## 2020-11-21 DIAGNOSIS — R739 Hyperglycemia, unspecified: Secondary | ICD-10-CM | POA: Diagnosis not present

## 2020-11-21 DIAGNOSIS — Z79890 Hormone replacement therapy (postmenopausal): Secondary | ICD-10-CM | POA: Diagnosis not present

## 2020-11-26 DIAGNOSIS — G609 Hereditary and idiopathic neuropathy, unspecified: Secondary | ICD-10-CM | POA: Diagnosis not present

## 2020-11-26 DIAGNOSIS — Z0000 Encounter for general adult medical examination without abnormal findings: Secondary | ICD-10-CM | POA: Diagnosis not present

## 2020-11-26 DIAGNOSIS — F5101 Primary insomnia: Secondary | ICD-10-CM | POA: Diagnosis not present

## 2020-11-27 ENCOUNTER — Other Ambulatory Visit (HOSPITAL_COMMUNITY): Payer: Self-pay | Admitting: Family Medicine

## 2020-11-27 MED FILL — LIDOCAINE-PRILOCAINE CREAM: 2.5-2.5 | 14 days supply | Qty: 30 | Fill #5

## 2020-11-27 MED FILL — GABAPENTIN 300 MG CAPSULE: 300 | 30 days supply | Qty: 30 | Fill #0

## 2020-12-02 MED FILL — GABAPENTIN 100 MG CAPSULE: 100 | 30 days supply | Qty: 180 | Fill #4

## 2020-12-09 ENCOUNTER — Other Ambulatory Visit (HOSPITAL_COMMUNITY): Payer: Self-pay | Admitting: Neurology

## 2020-12-09 DIAGNOSIS — G629 Polyneuropathy, unspecified: Secondary | ICD-10-CM | POA: Diagnosis not present

## 2021-01-01 ENCOUNTER — Other Ambulatory Visit: Payer: Self-pay

## 2021-01-01 ENCOUNTER — Ambulatory Visit (INDEPENDENT_AMBULATORY_CARE_PROVIDER_SITE_OTHER): Payer: 59 | Admitting: Ophthalmology

## 2021-01-01 ENCOUNTER — Encounter (INDEPENDENT_AMBULATORY_CARE_PROVIDER_SITE_OTHER): Payer: Self-pay | Admitting: Ophthalmology

## 2021-01-01 DIAGNOSIS — G43109 Migraine with aura, not intractable, without status migrainosus: Secondary | ICD-10-CM | POA: Diagnosis not present

## 2021-01-01 DIAGNOSIS — H43813 Vitreous degeneration, bilateral: Secondary | ICD-10-CM | POA: Diagnosis not present
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
## 2021-01-01 NOTE — Assessment & Plan Note (Signed)
The symptoms reviewed with the patient.  They did not have a following headache although she did have a headache she considered a migraine earlier that morning.  No specific therapy warranted.  There is no retinal pathology these findings were discussed.  Did suggest however the patient could consider using a magnesium supplement once daily, 400 mg Liquigel and attempt to stabilize smooth muscle and small right retinal and potentially CNS vasculature which might trigger migraine events.

## 2021-01-06 ENCOUNTER — Other Ambulatory Visit (HOSPITAL_COMMUNITY): Payer: Self-pay

## 2021-01-06 MED FILL — Gabapentin Cap 100 MG: ORAL | 90 days supply | Qty: 540 | Fill #0 | Status: AC

## 2021-01-07 ENCOUNTER — Other Ambulatory Visit (HOSPITAL_COMMUNITY): Payer: Self-pay

## 2021-01-08 ENCOUNTER — Other Ambulatory Visit (HOSPITAL_COMMUNITY): Payer: Self-pay

## 2021-01-27 ENCOUNTER — Other Ambulatory Visit (HOSPITAL_COMMUNITY): Payer: Self-pay

## 2021-01-27 MED ORDER — ZOLPIDEM TARTRATE 5 MG PO TABS
5.0000 mg | ORAL_TABLET | Freq: Every evening | ORAL | 0 refills | Status: DC | PRN
  Filled 2021-01-27 – 2021-02-05 (×2): qty 30, 30d supply, fill #0

## 2021-01-27 MED FILL — Gabapentin Cap 300 MG: ORAL | 30 days supply | Qty: 30 | Fill #0 | Status: CN

## 2021-02-02 ENCOUNTER — Other Ambulatory Visit (HOSPITAL_COMMUNITY): Payer: Self-pay

## 2021-02-04 ENCOUNTER — Other Ambulatory Visit (HOSPITAL_COMMUNITY): Payer: Self-pay

## 2021-02-05 ENCOUNTER — Other Ambulatory Visit (HOSPITAL_COMMUNITY): Payer: Self-pay

## 2021-02-05 MED FILL — Gabapentin Cap 300 MG: ORAL | 30 days supply | Qty: 30 | Fill #0 | Status: CN

## 2021-02-05 MED FILL — Gabapentin Cap 300 MG: ORAL | 30 days supply | Qty: 30 | Fill #0 | Status: AC

## 2021-02-08 ENCOUNTER — Telehealth (HOSPITAL_COMMUNITY): Payer: Self-pay | Admitting: Pharmacist

## 2021-02-08 NOTE — Telephone Encounter (Signed)
Opened erroneously

## 2021-02-23 ENCOUNTER — Other Ambulatory Visit (HOSPITAL_BASED_OUTPATIENT_CLINIC_OR_DEPARTMENT_OTHER): Payer: Self-pay | Admitting: Family Medicine

## 2021-02-23 DIAGNOSIS — Z1231 Encounter for screening mammogram for malignant neoplasm of breast: Secondary | ICD-10-CM

## 2021-02-25 ENCOUNTER — Encounter (INDEPENDENT_AMBULATORY_CARE_PROVIDER_SITE_OTHER): Payer: 59 | Admitting: Ophthalmology

## 2021-03-13 ENCOUNTER — Other Ambulatory Visit (HOSPITAL_COMMUNITY): Payer: Self-pay

## 2021-03-13 MED FILL — Gabapentin Cap 300 MG: ORAL | 30 days supply | Qty: 30 | Fill #1 | Status: AC

## 2021-03-19 ENCOUNTER — Other Ambulatory Visit (HOSPITAL_COMMUNITY): Payer: Self-pay

## 2021-03-20 ENCOUNTER — Other Ambulatory Visit (HOSPITAL_COMMUNITY): Payer: Self-pay

## 2021-03-20 MED ORDER — ZOLPIDEM TARTRATE 5 MG PO TABS
5.0000 mg | ORAL_TABLET | Freq: Every evening | ORAL | 0 refills | Status: DC
  Filled 2021-03-20: qty 30, 30d supply, fill #0

## 2021-04-07 ENCOUNTER — Other Ambulatory Visit (HOSPITAL_COMMUNITY): Payer: Self-pay

## 2021-04-07 MED ORDER — GABAPENTIN 100 MG PO CAPS
100.0000 mg | ORAL_CAPSULE | Freq: Three times a day (TID) | ORAL | 0 refills | Status: DC
  Filled 2021-04-07: qty 21, 7d supply, fill #0

## 2021-04-07 MED ORDER — GABAPENTIN 300 MG PO CAPS
300.0000 mg | ORAL_CAPSULE | Freq: Every day | ORAL | 0 refills | Status: DC
  Filled 2021-04-07 (×2): qty 30, 30d supply, fill #0

## 2021-04-07 MED FILL — Gabapentin Cap 100 MG: ORAL | 30 days supply | Qty: 300 | Fill #0 | Status: CN

## 2021-04-07 MED FILL — Gabapentin Cap 100 MG: ORAL | 30 days supply | Qty: 300 | Fill #0 | Status: AC

## 2021-04-08 ENCOUNTER — Other Ambulatory Visit (HOSPITAL_COMMUNITY): Payer: Self-pay

## 2021-04-09 ENCOUNTER — Other Ambulatory Visit (HOSPITAL_COMMUNITY): Payer: Self-pay

## 2021-04-09 MED ORDER — GABAPENTIN 100 MG PO CAPS
200.0000 mg | ORAL_CAPSULE | ORAL | 11 refills | Status: DC
  Filled 2021-04-09 – 2021-08-20 (×2): qty 300, 30d supply, fill #0
  Filled 2021-09-21: qty 300, 30d supply, fill #1
  Filled 2021-11-12: qty 300, 30d supply, fill #2
  Filled 2022-01-18: qty 300, 30d supply, fill #3
  Filled 2022-03-09 – 2022-03-12 (×2): qty 300, 30d supply, fill #4
  Filled 2022-04-05: qty 300, 30d supply, fill #5

## 2021-04-10 ENCOUNTER — Other Ambulatory Visit (HOSPITAL_COMMUNITY): Payer: Self-pay

## 2021-04-10 MED ORDER — GABAPENTIN 300 MG PO CAPS
300.0000 mg | ORAL_CAPSULE | Freq: Every day | ORAL | 11 refills | Status: DC
  Filled 2021-04-10: qty 30, 30d supply, fill #0
  Filled 2021-05-24: qty 30, 30d supply, fill #1
  Filled 2021-06-25: qty 30, 30d supply, fill #2
  Filled 2021-07-23: qty 30, 30d supply, fill #3
  Filled 2021-08-20: qty 30, 30d supply, fill #4
  Filled 2021-09-18: qty 30, 30d supply, fill #5
  Filled 2021-10-21: qty 30, 30d supply, fill #6
  Filled 2021-11-23: qty 30, 30d supply, fill #7
  Filled 2021-12-31: qty 30, 30d supply, fill #8
  Filled 2022-02-11: qty 30, 30d supply, fill #9
  Filled 2022-03-12: qty 30, 30d supply, fill #10

## 2021-04-14 ENCOUNTER — Other Ambulatory Visit: Payer: Self-pay

## 2021-04-14 ENCOUNTER — Ambulatory Visit (HOSPITAL_BASED_OUTPATIENT_CLINIC_OR_DEPARTMENT_OTHER)
Admission: RE | Admit: 2021-04-14 | Discharge: 2021-04-14 | Disposition: A | Discharge status: Home / Self Care | Payer: 59 | Source: Ambulatory Visit | Attending: Family Medicine | Admitting: Family Medicine

## 2021-04-14 ENCOUNTER — Encounter (HOSPITAL_BASED_OUTPATIENT_CLINIC_OR_DEPARTMENT_OTHER): Payer: Self-pay

## 2021-04-14 DIAGNOSIS — Z1231 Encounter for screening mammogram for malignant neoplasm of breast: Secondary | ICD-10-CM | POA: Diagnosis not present

## 2021-04-28 DIAGNOSIS — G629 Polyneuropathy, unspecified: Secondary | ICD-10-CM | POA: Diagnosis not present

## 2021-05-18 ENCOUNTER — Other Ambulatory Visit (HOSPITAL_COMMUNITY): Payer: Self-pay

## 2021-05-19 ENCOUNTER — Other Ambulatory Visit (HOSPITAL_COMMUNITY): Payer: Self-pay

## 2021-05-19 MED ORDER — ZOLPIDEM TARTRATE 5 MG PO TABS
5.0000 mg | ORAL_TABLET | Freq: Every evening | ORAL | 0 refills | Status: DC | PRN
  Filled 2021-05-19: qty 30, 30d supply, fill #0

## 2021-05-25 ENCOUNTER — Other Ambulatory Visit (HOSPITAL_COMMUNITY): Payer: Self-pay

## 2021-05-26 ENCOUNTER — Other Ambulatory Visit (HOSPITAL_COMMUNITY): Payer: Self-pay

## 2021-05-26 MED FILL — Gabapentin Cap 100 MG: ORAL | 30 days supply | Qty: 300 | Fill #1 | Status: AC

## 2021-05-27 ENCOUNTER — Other Ambulatory Visit (HOSPITAL_COMMUNITY): Payer: Self-pay

## 2021-05-30 DIAGNOSIS — M79606 Pain in leg, unspecified: Secondary | ICD-10-CM | POA: Diagnosis not present

## 2021-06-04 DIAGNOSIS — M79606 Pain in leg, unspecified: Secondary | ICD-10-CM | POA: Diagnosis not present

## 2021-06-25 ENCOUNTER — Other Ambulatory Visit (HOSPITAL_COMMUNITY): Payer: Self-pay

## 2021-07-09 ENCOUNTER — Other Ambulatory Visit (HOSPITAL_COMMUNITY): Payer: Self-pay

## 2021-07-09 MED FILL — Gabapentin Cap 100 MG: ORAL | 30 days supply | Qty: 300 | Fill #2 | Status: AC

## 2021-07-20 ENCOUNTER — Other Ambulatory Visit (HOSPITAL_COMMUNITY): Payer: Self-pay

## 2021-07-20 DIAGNOSIS — M79601 Pain in right arm: Secondary | ICD-10-CM | POA: Diagnosis not present

## 2021-07-20 DIAGNOSIS — M79661 Pain in right lower leg: Secondary | ICD-10-CM | POA: Diagnosis not present

## 2021-07-20 DIAGNOSIS — R52 Pain, unspecified: Secondary | ICD-10-CM | POA: Diagnosis not present

## 2021-07-20 DIAGNOSIS — M79662 Pain in left lower leg: Secondary | ICD-10-CM | POA: Diagnosis not present

## 2021-07-21 ENCOUNTER — Other Ambulatory Visit (HOSPITAL_COMMUNITY): Payer: Self-pay

## 2021-07-21 MED ORDER — ZOLPIDEM TARTRATE 5 MG PO TABS
5.0000 mg | ORAL_TABLET | Freq: Every evening | ORAL | 0 refills | Status: AC | PRN
  Filled 2021-07-21: qty 30, 30d supply, fill #0

## 2021-07-22 DIAGNOSIS — Z23 Encounter for immunization: Secondary | ICD-10-CM | POA: Diagnosis not present

## 2021-07-23 ENCOUNTER — Other Ambulatory Visit (HOSPITAL_COMMUNITY): Payer: Self-pay

## 2021-07-29 DIAGNOSIS — H5203 Hypermetropia, bilateral: Secondary | ICD-10-CM | POA: Diagnosis not present

## 2021-07-30 DIAGNOSIS — M79661 Pain in right lower leg: Secondary | ICD-10-CM | POA: Diagnosis not present

## 2021-07-30 DIAGNOSIS — R52 Pain, unspecified: Secondary | ICD-10-CM | POA: Diagnosis not present

## 2021-07-30 DIAGNOSIS — M79601 Pain in right arm: Secondary | ICD-10-CM | POA: Diagnosis not present

## 2021-07-30 DIAGNOSIS — M79662 Pain in left lower leg: Secondary | ICD-10-CM | POA: Diagnosis not present

## 2021-07-30 IMAGING — MG MM DIGITAL SCREENING BILAT W/ TOMO AND CAD
8 series · 9 of 24 positions shown · non-contrast
Comparison: Previous exam(s).

CLINICAL DATA: Screening.

EXAM:
DIGITAL SCREENING BILATERAL MAMMOGRAM WITH TOMOSYNTHESIS AND CAD
TECHNIQUE: Bilateral screening digital craniocaudal and mediolateral oblique
mammograms were obtained. Bilateral screening digital breast
tomosynthesis was performed. The images were evaluated with
computer-aided detection.

[R MLO synth-2D]
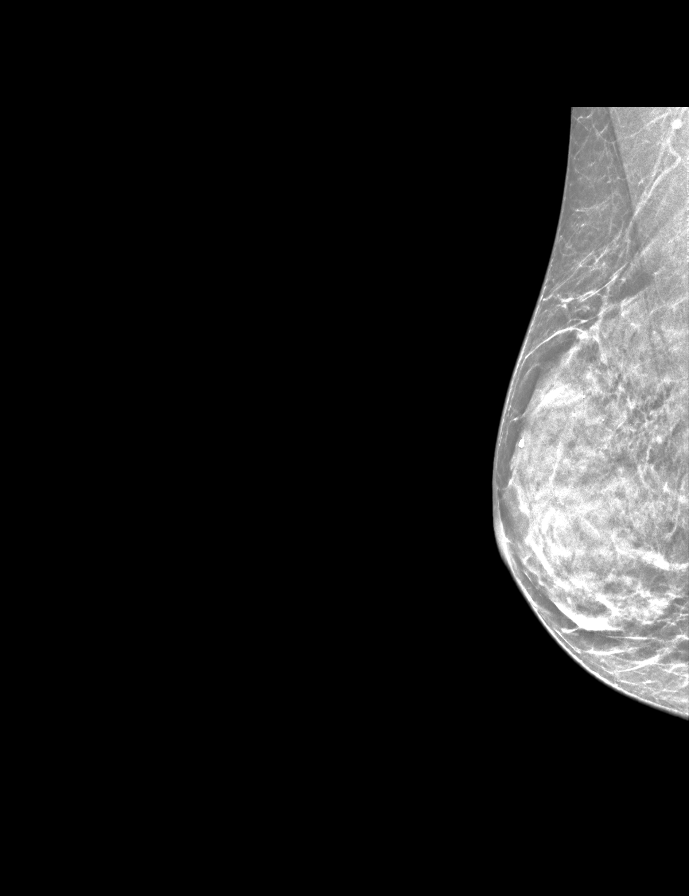

[L MLO synth-2D]
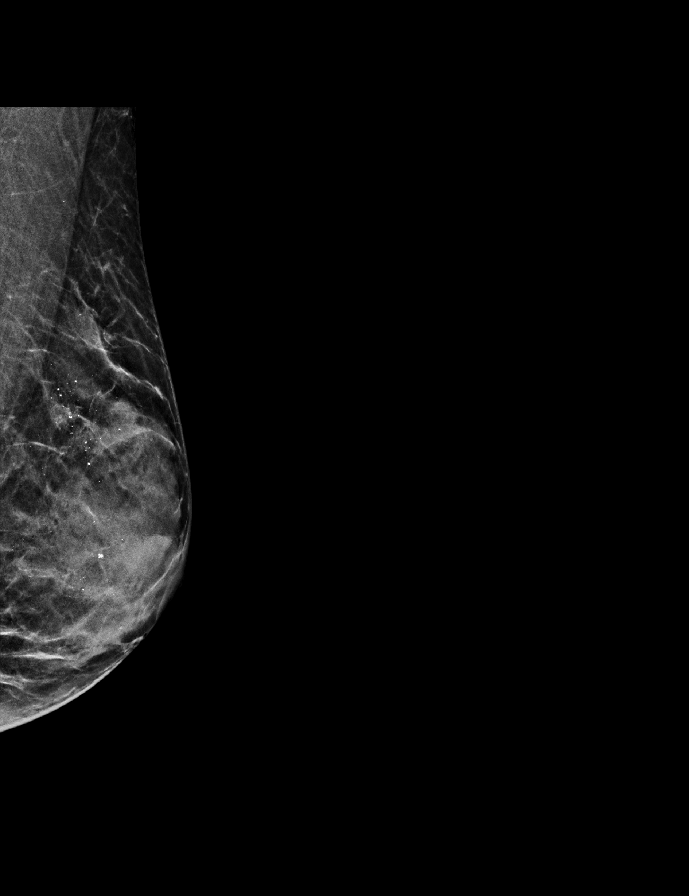

[L CC synth-2D]
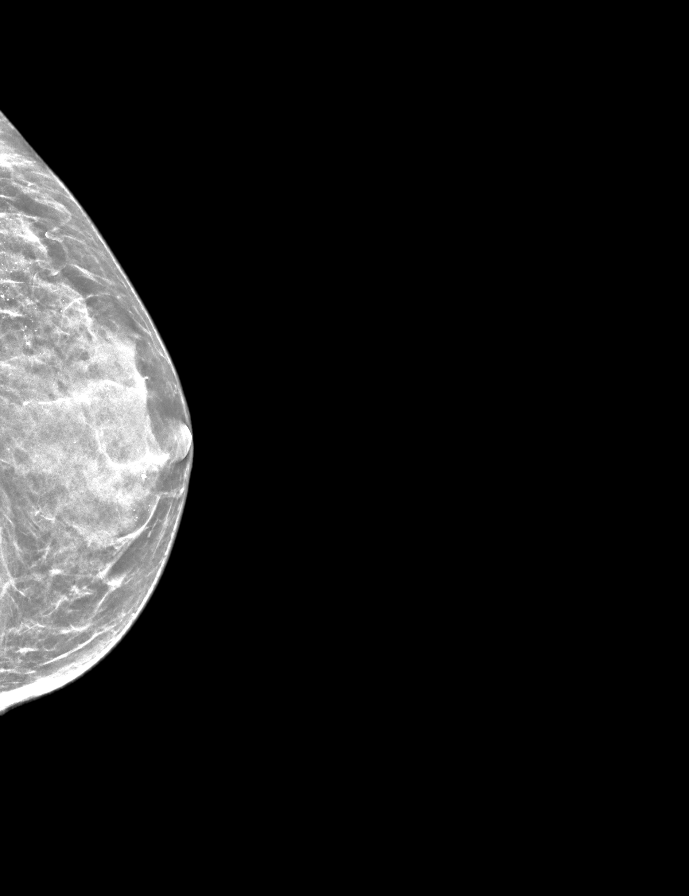

[R CC synth-2D]
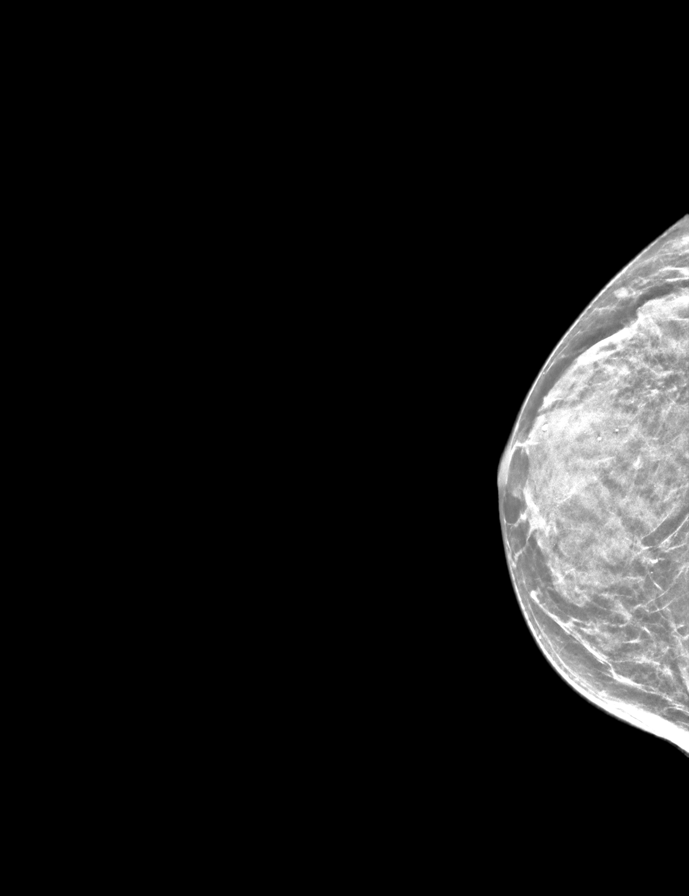

[R CC tomo · 2 of 39 frames shown]
[frame 13/39]
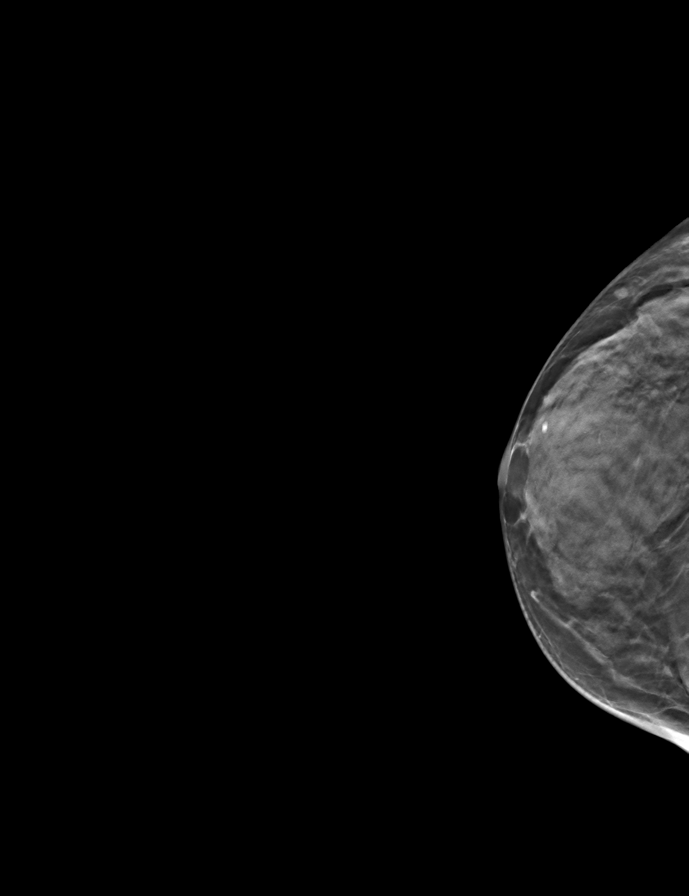
[frame 20/39]
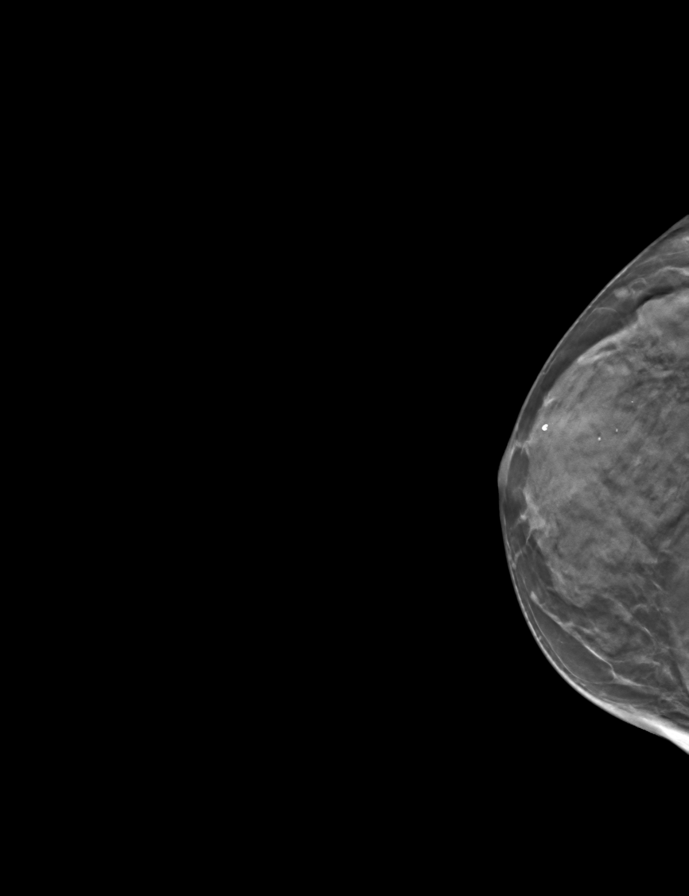

[L MLO tomo · tomo slice 21/40.0]
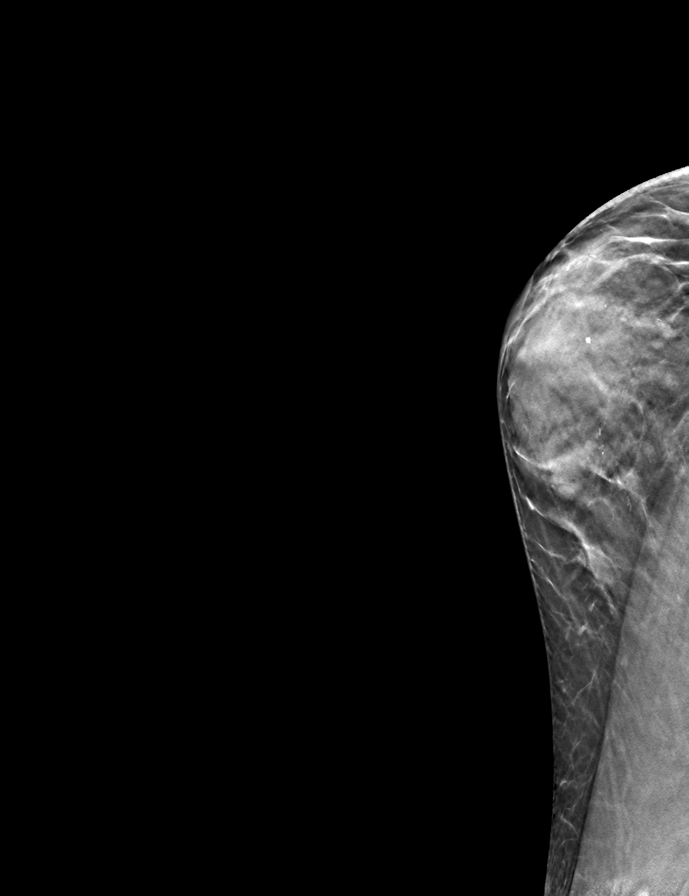

[R MLO tomo · tomo slice 20/39.0]
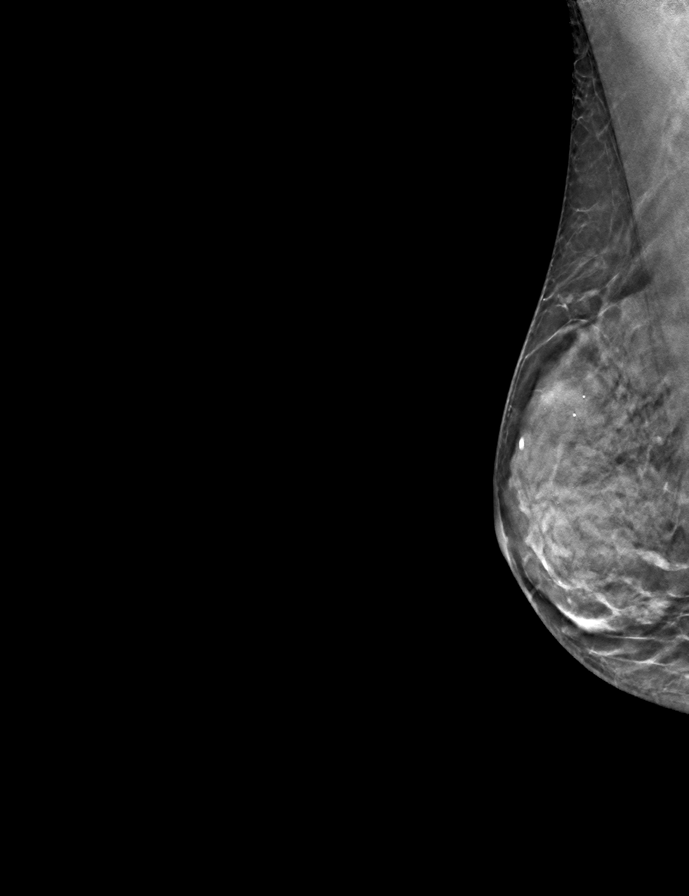

[L CC tomo · tomo slice 20/39.0]
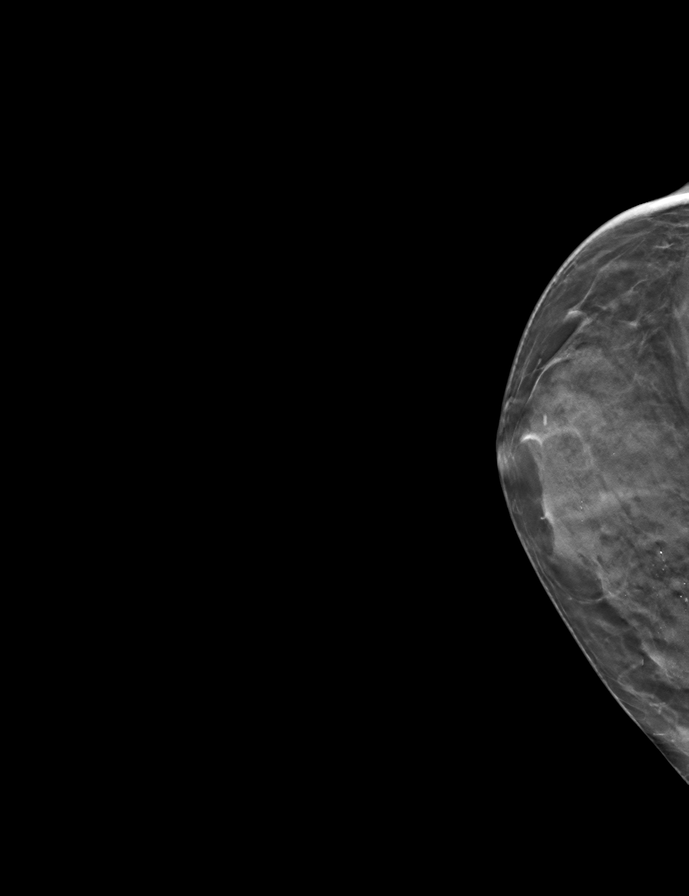

[9 of 24 positions shown; findings below may reference images not displayed]

ACR Breast Density Category d: The breast tissue is extremely dense,
which lowers the sensitivity of mammography
FINDINGS: There are no findings suspicious for malignancy.
IMPRESSION: No mammographic evidence of malignancy. A result letter of this
screening mammogram will be mailed directly to the patient.

RECOMMENDATION:
Screening mammogram in one year. (Code:TA-[id number])

BI-RADS CATEGORY  1: Negative.

## 2021-08-20 ENCOUNTER — Other Ambulatory Visit (HOSPITAL_COMMUNITY): Payer: Self-pay

## 2021-09-01 ENCOUNTER — Other Ambulatory Visit (HOSPITAL_COMMUNITY): Payer: Self-pay

## 2021-09-01 DIAGNOSIS — G629 Polyneuropathy, unspecified: Secondary | ICD-10-CM | POA: Diagnosis not present

## 2021-09-01 MED ORDER — GABAPENTIN 300 MG PO CAPS
300.0000 mg | ORAL_CAPSULE | ORAL | 11 refills | Status: DC
  Filled 2021-09-01 – 2022-03-12 (×2): qty 150, 30d supply, fill #0

## 2021-09-01 MED ORDER — AMITRIPTYLINE HCL 10 MG PO TABS
ORAL_TABLET | ORAL | 11 refills | Status: DC
  Filled 2021-09-01: qty 60, 30d supply, fill #0
  Filled 2021-11-03 – 2021-11-12 (×2): qty 60, 30d supply, fill #1
  Filled 2021-12-31: qty 60, 30d supply, fill #2
  Filled 2022-03-09: qty 60, 30d supply, fill #3
  Filled 2022-04-05: qty 60, 30d supply, fill #4
  Filled 2022-05-07: qty 60, 30d supply, fill #5
  Filled 2022-06-18: qty 60, 30d supply, fill #6
  Filled 2022-07-29: qty 60, 30d supply, fill #7
  Filled 2022-08-27: qty 60, 30d supply, fill #8

## 2021-09-18 ENCOUNTER — Other Ambulatory Visit (HOSPITAL_COMMUNITY): Payer: Self-pay

## 2021-09-21 ENCOUNTER — Other Ambulatory Visit (HOSPITAL_COMMUNITY): Payer: Self-pay

## 2021-10-22 ENCOUNTER — Other Ambulatory Visit (HOSPITAL_COMMUNITY): Payer: Self-pay

## 2021-11-03 ENCOUNTER — Other Ambulatory Visit (HOSPITAL_COMMUNITY): Payer: Self-pay

## 2021-11-10 ENCOUNTER — Other Ambulatory Visit (HOSPITAL_COMMUNITY): Payer: Self-pay

## 2021-11-11 ENCOUNTER — Other Ambulatory Visit (HOSPITAL_COMMUNITY): Payer: Self-pay

## 2021-11-12 ENCOUNTER — Other Ambulatory Visit (HOSPITAL_COMMUNITY): Payer: Self-pay

## 2021-11-23 ENCOUNTER — Other Ambulatory Visit (HOSPITAL_COMMUNITY): Payer: Self-pay

## 2021-12-08 DIAGNOSIS — G629 Polyneuropathy, unspecified: Secondary | ICD-10-CM | POA: Diagnosis not present

## 2021-12-21 ENCOUNTER — Other Ambulatory Visit (HOSPITAL_COMMUNITY): Payer: Self-pay

## 2021-12-31 ENCOUNTER — Other Ambulatory Visit (HOSPITAL_COMMUNITY): Payer: Self-pay

## 2022-01-18 ENCOUNTER — Other Ambulatory Visit (HOSPITAL_COMMUNITY): Payer: Self-pay

## 2022-01-25 DIAGNOSIS — L570 Actinic keratosis: Secondary | ICD-10-CM | POA: Diagnosis not present

## 2022-01-25 DIAGNOSIS — L821 Other seborrheic keratosis: Secondary | ICD-10-CM | POA: Diagnosis not present

## 2022-01-25 DIAGNOSIS — H02729 Madarosis of unspecified eye, unspecified eyelid and periocular area: Secondary | ICD-10-CM | POA: Diagnosis not present

## 2022-01-25 DIAGNOSIS — D225 Melanocytic nevi of trunk: Secondary | ICD-10-CM | POA: Diagnosis not present

## 2022-01-25 DIAGNOSIS — L820 Inflamed seborrheic keratosis: Secondary | ICD-10-CM | POA: Diagnosis not present

## 2022-02-01 DIAGNOSIS — R6889 Other general symptoms and signs: Secondary | ICD-10-CM | POA: Diagnosis not present

## 2022-02-01 DIAGNOSIS — J40 Bronchitis, not specified as acute or chronic: Secondary | ICD-10-CM | POA: Diagnosis not present

## 2022-02-01 DIAGNOSIS — J219 Acute bronchiolitis, unspecified: Secondary | ICD-10-CM | POA: Diagnosis not present

## 2022-02-11 ENCOUNTER — Other Ambulatory Visit (HOSPITAL_COMMUNITY): Payer: Self-pay

## 2022-03-03 ENCOUNTER — Other Ambulatory Visit (HOSPITAL_COMMUNITY): Payer: Self-pay

## 2022-03-03 DIAGNOSIS — U071 COVID-19: Secondary | ICD-10-CM | POA: Diagnosis not present

## 2022-03-03 MED ORDER — BENZONATATE 200 MG PO CAPS
200.0000 mg | ORAL_CAPSULE | Freq: Three times a day (TID) | ORAL | 0 refills | Status: AC | PRN
  Filled 2022-03-03: qty 42, 14d supply, fill #0

## 2022-03-09 ENCOUNTER — Other Ambulatory Visit (HOSPITAL_COMMUNITY): Payer: Self-pay

## 2022-03-09 ENCOUNTER — Other Ambulatory Visit: Payer: Self-pay | Admitting: Neurology

## 2022-03-10 ENCOUNTER — Other Ambulatory Visit (HOSPITAL_COMMUNITY): Payer: Self-pay

## 2022-03-11 ENCOUNTER — Other Ambulatory Visit (HOSPITAL_COMMUNITY): Payer: Self-pay

## 2022-03-12 ENCOUNTER — Other Ambulatory Visit (HOSPITAL_COMMUNITY): Payer: Self-pay

## 2022-03-13 DIAGNOSIS — Z13228 Encounter for screening for other metabolic disorders: Secondary | ICD-10-CM | POA: Diagnosis not present

## 2022-03-13 DIAGNOSIS — Z1321 Encounter for screening for nutritional disorder: Secondary | ICD-10-CM | POA: Diagnosis not present

## 2022-03-13 DIAGNOSIS — Z13220 Encounter for screening for lipoid disorders: Secondary | ICD-10-CM | POA: Diagnosis not present

## 2022-03-13 DIAGNOSIS — Z1329 Encounter for screening for other suspected endocrine disorder: Secondary | ICD-10-CM | POA: Diagnosis not present

## 2022-03-13 DIAGNOSIS — R5383 Other fatigue: Secondary | ICD-10-CM | POA: Diagnosis not present

## 2022-03-13 DIAGNOSIS — G609 Hereditary and idiopathic neuropathy, unspecified: Secondary | ICD-10-CM | POA: Diagnosis not present

## 2022-03-13 DIAGNOSIS — Z0000 Encounter for general adult medical examination without abnormal findings: Secondary | ICD-10-CM | POA: Diagnosis not present

## 2022-03-17 ENCOUNTER — Other Ambulatory Visit (HOSPITAL_COMMUNITY): Payer: Self-pay

## 2022-03-17 DIAGNOSIS — Z1211 Encounter for screening for malignant neoplasm of colon: Secondary | ICD-10-CM | POA: Diagnosis not present

## 2022-03-17 DIAGNOSIS — G609 Hereditary and idiopathic neuropathy, unspecified: Secondary | ICD-10-CM | POA: Diagnosis not present

## 2022-03-17 DIAGNOSIS — Z13820 Encounter for screening for osteoporosis: Secondary | ICD-10-CM | POA: Diagnosis not present

## 2022-03-17 DIAGNOSIS — Z780 Asymptomatic menopausal state: Secondary | ICD-10-CM | POA: Diagnosis not present

## 2022-03-17 DIAGNOSIS — Z1231 Encounter for screening mammogram for malignant neoplasm of breast: Secondary | ICD-10-CM | POA: Diagnosis not present

## 2022-03-17 DIAGNOSIS — Z0000 Encounter for general adult medical examination without abnormal findings: Secondary | ICD-10-CM | POA: Diagnosis not present

## 2022-03-17 MED ORDER — LIDOCAINE-PRILOCAINE 2.5-2.5 % EX CREA
1.0000 "application " | TOPICAL_CREAM | Freq: Four times a day (QID) | CUTANEOUS | 11 refills | Status: AC
  Filled 2022-03-17: qty 30, 15d supply, fill #0

## 2022-03-26 ENCOUNTER — Other Ambulatory Visit (HOSPITAL_BASED_OUTPATIENT_CLINIC_OR_DEPARTMENT_OTHER): Payer: Self-pay | Admitting: Family Medicine

## 2022-03-26 ENCOUNTER — Other Ambulatory Visit (HOSPITAL_COMMUNITY): Payer: Self-pay

## 2022-03-26 DIAGNOSIS — Z1231 Encounter for screening mammogram for malignant neoplasm of breast: Secondary | ICD-10-CM

## 2022-03-26 DIAGNOSIS — Z13820 Encounter for screening for osteoporosis: Secondary | ICD-10-CM

## 2022-03-26 MED ORDER — ONDANSETRON HCL 4 MG PO TABS
4.0000 mg | ORAL_TABLET | Freq: Two times a day (BID) | ORAL | 0 refills | Status: AC
  Filled 2022-03-26: qty 20, 10d supply, fill #0

## 2022-03-31 DIAGNOSIS — G629 Polyneuropathy, unspecified: Secondary | ICD-10-CM | POA: Diagnosis not present

## 2022-04-05 ENCOUNTER — Other Ambulatory Visit (HOSPITAL_COMMUNITY): Payer: Self-pay

## 2022-04-07 ENCOUNTER — Ambulatory Visit (HOSPITAL_BASED_OUTPATIENT_CLINIC_OR_DEPARTMENT_OTHER): Payer: 59

## 2022-04-20 ENCOUNTER — Encounter (HOSPITAL_BASED_OUTPATIENT_CLINIC_OR_DEPARTMENT_OTHER): Payer: Self-pay

## 2022-04-20 ENCOUNTER — Ambulatory Visit (HOSPITAL_BASED_OUTPATIENT_CLINIC_OR_DEPARTMENT_OTHER)
Admission: RE | Admit: 2022-04-20 | Discharge: 2022-04-20 | Disposition: A | Discharge status: Home / Self Care | Payer: 59 | Source: Ambulatory Visit | Attending: Family Medicine | Admitting: Family Medicine

## 2022-04-20 ENCOUNTER — Other Ambulatory Visit (HOSPITAL_COMMUNITY): Payer: Self-pay

## 2022-04-20 DIAGNOSIS — Z1231 Encounter for screening mammogram for malignant neoplasm of breast: Secondary | ICD-10-CM | POA: Insufficient documentation

## 2022-04-20 DIAGNOSIS — M8589 Other specified disorders of bone density and structure, multiple sites: Secondary | ICD-10-CM | POA: Diagnosis not present

## 2022-04-20 DIAGNOSIS — Z13820 Encounter for screening for osteoporosis: Secondary | ICD-10-CM | POA: Insufficient documentation

## 2022-04-22 ENCOUNTER — Other Ambulatory Visit (HOSPITAL_COMMUNITY): Payer: Self-pay

## 2022-04-23 ENCOUNTER — Other Ambulatory Visit (HOSPITAL_COMMUNITY): Payer: Self-pay

## 2022-04-23 MED ORDER — GABAPENTIN 300 MG PO CAPS
300.0000 mg | ORAL_CAPSULE | Freq: Every day | ORAL | 11 refills | Status: DC
Start: 2022-04-23 — End: 2023-08-11
  Filled 2022-04-23: qty 30, 30d supply, fill #0
  Filled 2022-05-07 – 2022-05-27 (×2): qty 30, 30d supply, fill #1
  Filled 2022-06-24: qty 30, 30d supply, fill #2
  Filled 2022-07-29: qty 30, 30d supply, fill #3
  Filled 2022-08-27: qty 30, 30d supply, fill #4
  Filled 2022-10-01 (×2): qty 30, 30d supply, fill #5
  Filled 2022-10-28: qty 30, 30d supply, fill #6
  Filled 2022-12-07: qty 30, 30d supply, fill #7
  Filled 2023-01-04: qty 30, 30d supply, fill #8
  Filled 2023-02-13: qty 30, 30d supply, fill #9
  Filled 2023-03-14: qty 30, 30d supply, fill #10
  Filled 2023-04-12: qty 30, 30d supply, fill #11

## 2022-05-07 ENCOUNTER — Other Ambulatory Visit (HOSPITAL_COMMUNITY): Payer: Self-pay

## 2022-05-27 ENCOUNTER — Other Ambulatory Visit (HOSPITAL_COMMUNITY): Payer: Self-pay

## 2022-06-18 ENCOUNTER — Other Ambulatory Visit (HOSPITAL_COMMUNITY): Payer: Self-pay

## 2022-06-22 ENCOUNTER — Other Ambulatory Visit (HOSPITAL_COMMUNITY): Payer: Self-pay

## 2022-06-24 ENCOUNTER — Other Ambulatory Visit (HOSPITAL_COMMUNITY): Payer: Self-pay

## 2022-06-25 ENCOUNTER — Other Ambulatory Visit (HOSPITAL_COMMUNITY): Payer: Self-pay

## 2022-06-25 MED ORDER — GABAPENTIN 100 MG PO CAPS
300.0000 mg | ORAL_CAPSULE | Freq: Three times a day (TID) | ORAL | 11 refills | Status: AC
  Filled 2022-06-25: qty 270, 30d supply, fill #0
  Filled 2022-07-29: qty 270, 30d supply, fill #1
  Filled 2022-09-14: qty 270, 30d supply, fill #2
  Filled 2022-10-15: qty 270, 30d supply, fill #3

## 2022-07-29 ENCOUNTER — Other Ambulatory Visit (HOSPITAL_COMMUNITY): Payer: Self-pay

## 2022-08-27 ENCOUNTER — Other Ambulatory Visit (HOSPITAL_COMMUNITY): Payer: Self-pay

## 2022-09-14 ENCOUNTER — Other Ambulatory Visit (HOSPITAL_COMMUNITY): Payer: Self-pay

## 2022-09-23 ENCOUNTER — Other Ambulatory Visit (HOSPITAL_COMMUNITY): Payer: Self-pay

## 2022-09-23 MED ORDER — AMITRIPTYLINE HCL 10 MG PO TABS
ORAL_TABLET | ORAL | 11 refills | Status: AC
  Filled 2022-09-23: qty 60, 35d supply, fill #0
  Filled 2022-10-28: qty 60, 30d supply, fill #1
  Filled 2022-11-18: qty 60, 30d supply, fill #2
  Filled 2022-12-26: qty 60, 30d supply, fill #3
  Filled 2023-01-27: qty 60, 30d supply, fill #4

## 2022-09-24 ENCOUNTER — Other Ambulatory Visit (HOSPITAL_COMMUNITY): Payer: Self-pay

## 2022-10-01 ENCOUNTER — Other Ambulatory Visit (HOSPITAL_COMMUNITY): Payer: Self-pay

## 2022-10-01 ENCOUNTER — Other Ambulatory Visit: Payer: Self-pay

## 2022-10-15 ENCOUNTER — Other Ambulatory Visit (HOSPITAL_COMMUNITY): Payer: Self-pay

## 2022-10-28 ENCOUNTER — Other Ambulatory Visit (HOSPITAL_COMMUNITY): Payer: Self-pay

## 2022-11-16 ENCOUNTER — Other Ambulatory Visit (HOSPITAL_COMMUNITY): Payer: Self-pay

## 2022-11-16 DIAGNOSIS — G629 Polyneuropathy, unspecified: Secondary | ICD-10-CM | POA: Diagnosis not present

## 2022-11-16 MED ORDER — GABAPENTIN 100 MG PO CAPS
300.0000 mg | ORAL_CAPSULE | Freq: Three times a day (TID) | ORAL | 11 refills | Status: AC
  Filled 2022-11-16: qty 270, 30d supply, fill #0
  Filled 2022-12-20: qty 270, 30d supply, fill #1
  Filled 2023-01-24: qty 270, 30d supply, fill #2
  Filled 2023-02-28: qty 270, 30d supply, fill #3
  Filled 2023-03-28 (×2): qty 270, 30d supply, fill #4
  Filled 2023-05-03 (×2): qty 270, 30d supply, fill #5
  Filled 2023-08-08 (×2): qty 270, 30d supply, fill #6
  Filled 2023-09-03 – 2023-11-04 (×2): qty 270, 30d supply, fill #7

## 2022-11-16 MED ORDER — AMITRIPTYLINE HCL 10 MG PO TABS
30.0000 mg | ORAL_TABLET | Freq: Every day | ORAL | 11 refills | Status: AC
Start: 2022-11-16 — End: ?
  Filled 2022-11-16 – 2023-01-27 (×2): qty 90, 30d supply, fill #0
  Filled 2023-03-14: qty 90, 30d supply, fill #1
  Filled 2023-05-03: qty 90, 30d supply, fill #2
  Filled 2023-09-03 (×2): qty 90, 30d supply, fill #3

## 2022-11-18 ENCOUNTER — Other Ambulatory Visit (HOSPITAL_COMMUNITY): Payer: Self-pay

## 2022-11-22 ENCOUNTER — Other Ambulatory Visit (HOSPITAL_COMMUNITY): Payer: Self-pay

## 2022-12-20 ENCOUNTER — Other Ambulatory Visit (HOSPITAL_COMMUNITY): Payer: Self-pay

## 2022-12-26 ENCOUNTER — Other Ambulatory Visit: Payer: Self-pay

## 2022-12-27 ENCOUNTER — Other Ambulatory Visit (HOSPITAL_COMMUNITY): Payer: Self-pay

## 2022-12-27 ENCOUNTER — Other Ambulatory Visit: Payer: Self-pay

## 2023-01-04 ENCOUNTER — Other Ambulatory Visit (HOSPITAL_COMMUNITY): Payer: Self-pay

## 2023-01-27 ENCOUNTER — Other Ambulatory Visit (HOSPITAL_COMMUNITY): Payer: Self-pay

## 2023-02-16 ENCOUNTER — Other Ambulatory Visit: Payer: Self-pay

## 2023-02-16 ENCOUNTER — Ambulatory Visit: Payer: Commercial Managed Care - PPO | Attending: Neurology | Admitting: Physical Therapy

## 2023-02-16 ENCOUNTER — Other Ambulatory Visit (HOSPITAL_COMMUNITY): Payer: Self-pay

## 2023-02-16 DIAGNOSIS — M6281 Muscle weakness (generalized): Secondary | ICD-10-CM

## 2023-02-16 DIAGNOSIS — R279 Unspecified lack of coordination: Secondary | ICD-10-CM | POA: Diagnosis not present

## 2023-02-16 DIAGNOSIS — R293 Abnormal posture: Secondary | ICD-10-CM | POA: Diagnosis not present

## 2023-02-16 MED ORDER — PEG 3350-KCL-NA BICARB-NACL 420 G PO SOLR
ORAL | 0 refills | Status: AC
  Filled 2023-02-16: qty 4000, 1d supply, fill #0

## 2023-02-16 NOTE — Therapy (Signed)
OUTPATIENT PHYSICAL THERAPY FEMALE PELVIC EVALUATION   Patient Name: Destiny Dunlap MRN: [id number] DOB:1956-04-30, 67 y.o., female Today's Date: 02/16/2023  END OF SESSION:  PT End of Session - 02/16/23 0913     Visit Number 1    Date for PT Re-Evaluation 05/19/23    Authorization Type [place] [employer] PPO    PT Start Time 0930    PT Stop Time 1010    PT Time Calculation (min) 40 min    Activity Tolerance Patient tolerated treatment well    Behavior During Therapy [facility] for tasks assessed/performed             Past Medical History:  Diagnosis Date   Arthritis    Burning sensation    Frequent headaches    Insomnia    Past Surgical History:  Procedure Laterality Date   APPENDECTOMY     Patient Active Problem List   Diagnosis Date Noted   Ocular migraine 01/01/2021   Posterior vitreous detachment of both eyes 05/07/2020   Pain 02/13/2020   body pain 02/13/2020   Paresthesia 01/08/2020    PCP: Oletha Blend, MD  REFERRING PROVIDER: Jerl Mina, MD  REFERRING DIAG: G62.9 (ICD-10-CM) - Polyneuropathy, unspecified  THERAPY DIAG:  Muscle weakness (generalized)  Abnormal posture  Unspecified lack of coordination  Rationale for Evaluation and Treatment: Rehabilitation  ONSET DATE: 2 years ago   SUBJECTIVE:                                                                                                                                                                                           SUBJECTIVE STATEMENT: Since having small fiber neuropathy 2 years ago. Pt states she is starting to feel weakness in bowel and bladder. 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    PAIN:  Are you having pain? No   PRECAUTIONS: None  WEIGHT BEARING RESTRICTIONS: No  FALLS:  Has patient fallen in last 6 months? No  LIVING ENVIRONMENT: Lives with: lives with their family Lives in: House/apartment  OCCUPATION: OR nurse  PLOF:  Independent  PATIENT GOALS: to make sure she is doing pelvic floor contractions and have more awareness of pelvic floor.   PERTINENT HISTORY:  2 vaginal births, small fiber neuropathy  Sexual abuse: No  BOWEL MOVEMENT: Pain with bowel movement: No Type of bowel movement:Type (Bristol Stool Scale) 6-5, Frequency every other day, and Strain No Fully empty rectum: Yes:   Leakage: No Pads: No Fiber supplement: No  URINATION: Pain with urination: No Fully empty bladder: Yes:   Stream: Strong Urgency: No Frequency: several  times in the morning but more than every 2 hours, no nighttime Leakage:  none  Pads: No  INTERCOURSE: Pain with intercourse:  not painful  Ability to have vaginal penetration:  Yes: was having a lot of dryness but had estrogen cream and improved a lot  Climax: not painful  Marinoff Scale: 0/3  PREGNANCY: Vaginal deliveries 2 Tearing Yes: minimal tearing with both  C-section deliveries 0 Currently pregnant No  PROLAPSE: None   OBJECTIVE:   DIAGNOSTIC FINDINGS:    COGNITION: Overall cognitive status: Within functional limits for tasks assessed     SENSATION: Light touch: Appears intact Proprioception: Appears intact  MUSCLE LENGTH: Bil hamstrings and adductors limited by 25%   POSTURE: rounded shoulders   LUMBARAROM/PROM:  A/PROM A/PROM  eval  Flexion WFL   Extension WFL  Right lateral flexion WFL  Left lateral flexion WFL  Right rotation WFL  Left rotation WFL   (Blank rows = not tested)  LOWER EXTREMITY ROM:  WFL  LOWER EXTREMITY MMT:  Bil hips grossly 4/5, knees 5/5  PALPATION:   General  mild tension at upper abdominal quadrants but no TTP                External Perineal Exam mild redness at vulva, mild tissue atrophy                              Internal Pelvic Floor no TTP, mild tightness  Patient confirms identification and approves PT to assess internal pelvic floor and treatment Yes  PELVIC MMT:   MMT eval   Vaginal 3/5, 3s, 4 reps  Internal Anal Sphincter   External Anal Sphincter   Puborectalis   Diastasis Recti   (Blank rows = not tested)        TONE: WFL  PROLAPSE: Possible grade 1 anterior wall laxity with strong cough in hooklying   TODAY'S TREATMENT:                                                                                                                              DATE:   02/16/2023 EVAL Examination completed, findings reviewed, pt educated on POC, HEP. Pt motivated to participate in PT and agreeable to attempt recommendations.     PATIENT EDUCATION:  Education details: [email or web address] Person educated: Patient Education method: Programmer, multimedia, Demonstration, Actor cues, Verbal cues, and Handouts Education comprehension: verbalized understanding and returned demonstration  HOME EXERCISE PROGRAM: [email or web address]  ASSESSMENT:  CLINICAL IMPRESSION: Patient is a 67 y.o. female  who was seen today for physical therapy evaluation and treatment for lack of sensation at pelvic floor and feeling weakness at bladder/bowels. Pt denies leakage from bladder or bowels, fairly regular with bowels but does endorse loose stools, and reports she can't feel if she is able to contract pelvic floor. Pt found to have mild tension at abdomen, mild core and hip weakness, and mild decreased flexibility  with hips.  Patient consented to internal pelvic floor assessment vaginally this date and found to have decreased strength, endurance, and coordination. Patient benefited from verbal cues for improved technique with pelvic floor contractions breathing mechanics coordination. Pt demonstrated improved strength with coordinated with exhale and reports she is able to feel contractions better with this. Decreased endurance noted and lack of ability to feel quick flicks per pt. Pt did have improved feeling of mobility with sitting with towel roll over clothing between legs. Pt would benefit from additional PT to further  address deficits.     OBJECTIVE IMPAIRMENTS: decreased coordination, decreased endurance, decreased strength, increased fascial restrictions, impaired flexibility, improper body mechanics, and postural dysfunction.   ACTIVITY LIMITATIONS: continence  PARTICIPATION LIMITATIONS: community activity  PERSONAL FACTORS: Time since onset of injury/illness/exacerbation and 1 comorbidity: x2 vaginal births  are also affecting patient's functional outcome.   REHAB POTENTIAL: Good  CLINICAL DECISION MAKING: Stable/uncomplicated  EVALUATION COMPLEXITY: Low   GOALS: Goals reviewed with patient? Yes  SHORT TERM GOALS: Target date: 03/16/23  Pt to be I with HEP.  Baseline: Goal status: INITIAL  2.  Pt to demonstrate at least 3/5 pelvic floor strength and ability hold for 8s for improved pelvic stability and decreased strain at pelvic floor. Baseline:  Goal status: INITIAL  3.  Pt to be I with abdominal massage for improved stool type based on bristol stool scale to type 5 on average.  Baseline:  Goal status: INITIAL   LONG TERM GOALS: Target date: 05/19/23  Pt to be I with advanced HEP.  Baseline:  Goal status: INITIAL  2.  Pt to demonstrate at least 4/5 pelvic floor strength for improved pelvic stability and decreased strain at pelvic floor/ decrease leakage.  Baseline:  Goal status: INITIAL  3.  Pt to report average urinary urge to ~every 3-4 hours for improved bladder habits.  Baseline:  Goal status: INITIAL  4.  Pt to report ability to feel pelvic floor contractions at least 75% of the time with attempts.  Baseline:  Goal status: INITIAL   PLAN:  PT FREQUENCY: every other week  PT DURATION:  6 sessions  PLANNED INTERVENTIONS: Therapeutic exercises, Therapeutic activity, Neuromuscular re-education, Patient/Family education, Self Care, Joint mobilization, Aquatic Therapy, Dry Needling, Electrical stimulation, Spinal mobilization, Cryotherapy, Moist heat, scar  mobilization, Biofeedback, and Manual therapy  PLAN FOR NEXT SESSION: pelvic floor strengthening in various positions, coordination of breathing with pelvic floor and exercise, voiding mechanics.   Otelia Sergeant, PT, DPT 02/15/2409:50 AM

## 2023-03-02 DIAGNOSIS — Z1211 Encounter for screening for malignant neoplasm of colon: Secondary | ICD-10-CM | POA: Diagnosis not present

## 2023-03-02 DIAGNOSIS — D128 Benign neoplasm of rectum: Secondary | ICD-10-CM | POA: Diagnosis not present

## 2023-03-02 DIAGNOSIS — Z86010 Personal history of colonic polyps: Secondary | ICD-10-CM | POA: Diagnosis not present

## 2023-03-02 DIAGNOSIS — D120 Benign neoplasm of cecum: Secondary | ICD-10-CM | POA: Diagnosis not present

## 2023-03-28 ENCOUNTER — Other Ambulatory Visit (HOSPITAL_COMMUNITY): Payer: Self-pay

## 2023-03-30 ENCOUNTER — Ambulatory Visit: Payer: Commercial Managed Care - PPO | Admitting: Physical Therapy

## 2023-04-12 ENCOUNTER — Other Ambulatory Visit (HOSPITAL_COMMUNITY): Payer: Self-pay

## 2023-04-12 ENCOUNTER — Encounter: Payer: Commercial Managed Care - PPO | Admitting: Physical Therapy

## 2023-04-25 ENCOUNTER — Ambulatory Visit: Payer: Commercial Managed Care - PPO | Admitting: Physical Therapy

## 2023-05-03 ENCOUNTER — Other Ambulatory Visit (HOSPITAL_COMMUNITY): Payer: Self-pay

## 2023-05-09 ENCOUNTER — Ambulatory Visit: Payer: Medicare HMO | Attending: Neurology | Admitting: Physical Therapy

## 2023-05-09 DIAGNOSIS — R279 Unspecified lack of coordination: Secondary | ICD-10-CM

## 2023-05-09 DIAGNOSIS — M6281 Muscle weakness (generalized): Secondary | ICD-10-CM | POA: Diagnosis present

## 2023-05-09 NOTE — Addendum Note (Signed)
Addended by: Barbaraann Faster on: 05/09/2023 02:04 PM   Modules accepted: Orders

## 2023-05-09 NOTE — Therapy (Signed)
OUTPATIENT PHYSICAL THERAPY FEMALE PELVIC TREATMENT   Patient Name: Destiny Dunlap MRN: [id number] DOB:02/04/1956, 67 y.o., female Today's Date: 05/09/2023  END OF SESSION:  PT End of Session - 05/09/23 0933     Visit Number 2    Date for PT Re-Evaluation 05/19/23    Authorization Type [place] [employer]ET[employer] PPO    PT Start Time 0933    PT Stop Time 1012    PT Time Calculation (min) 39 min    Activity Tolerance Patient tolerated treatment well    Behavior During Therapy [facility] for tasks assessed/performed             Past Medical History:  Diagnosis Date   Arthritis    Burning sensation    Frequent headaches    Insomnia    Past Surgical History:  Procedure Laterality Date   APPENDECTOMY     Patient Active Problem List   Diagnosis Date Noted   Ocular migraine 01/01/2021   Posterior vitreous detachment of both eyes 05/07/2020   Pain 02/13/2020   body pain 02/13/2020   Paresthesia 01/08/2020    PCP: Oletha Blend, MD  REFERRING PROVIDER: Jerl Mina, MD  REFERRING DIAG: G62.9 (ICD-10-CM) - Polyneuropathy, unspecified  THERAPY DIAG:  Muscle weakness (generalized)  Unspecified lack of coordination  Rationale for Evaluation and Treatment: Rehabilitation  ONSET DATE: 2 years ago   SUBJECTIVE:                                                                                                                                                                                           SUBJECTIVE STATEMENT: Pt reports she has been doing HEP much better now, has better sensation of how/when she is doing them now. Can stop urine now.   PAIN:  Are you having pain? No   PRECAUTIONS: None  WEIGHT BEARING RESTRICTIONS: No  FALLS:  Has patient fallen in last 6 months? No  LIVING ENVIRONMENT: Lives with: lives with their family Lives in: House/apartment  OCCUPATION: OR nurse  PLOF: Independent  PATIENT GOALS: to make sure she is doing pelvic floor contractions  and have more awareness of pelvic floor.   PERTINENT HISTORY:  2 vaginal births, small fiber neuropathy  Sexual abuse: No  BOWEL MOVEMENT: Pain with bowel movement: No Type of bowel movement:Type (Bristol Stool Scale) 6-5, Frequency every other day, and Strain No Fully empty rectum: Yes:   Leakage: No Pads: No Fiber supplement: No  URINATION: Pain with urination: No Fully empty bladder: Yes:   Stream: Strong Urgency: No Frequency: several times in the morning but more than every 2 hours, no nighttime Leakage:  none  Pads: No  INTERCOURSE: Pain with intercourse:  not painful  Ability to have vaginal penetration:  Yes: was having a lot of dryness but had estrogen cream and improved a lot  Climax: not painful  Marinoff Scale: 0/3  PREGNANCY: Vaginal deliveries 2 Tearing Yes: minimal tearing with both  C-section deliveries 0 Currently pregnant No  PROLAPSE: None   OBJECTIVE:   DIAGNOSTIC FINDINGS:    COGNITION: Overall cognitive status: Within functional limits for tasks assessed     SENSATION: Light touch: Appears intact Proprioception: Appears intact  MUSCLE LENGTH: Bil hamstrings and adductors limited by 25%   POSTURE: rounded shoulders   LUMBARAROM/PROM:  A/PROM A/PROM  eval  Flexion WFL   Extension WFL  Right lateral flexion WFL  Left lateral flexion WFL  Right rotation WFL  Left rotation WFL   (Blank rows = not tested)  LOWER EXTREMITY ROM:  WFL  LOWER EXTREMITY MMT:  Bil hips grossly 4/5, knees 5/5  PALPATION:   General  mild tension at upper abdominal quadrants but no TTP                External Perineal Exam mild redness at vulva, mild tissue atrophy                              Internal Pelvic Floor no TTP, mild tightness  Patient confirms identification and approves PT to assess internal pelvic floor and treatment Yes  PELVIC MMT:   MMT eval  Vaginal 3/5, 3s, 4 reps  Internal Anal Sphincter   External Anal Sphincter    Puborectalis   Diastasis Recti   (Blank rows = not tested)        TONE: WFL  PROLAPSE: Possible grade 1 anterior wall laxity with strong cough in hooklying   TODAY'S TREATMENT:                                                                                                                              DATE:   02/16/2023 EVAL Examination completed, findings reviewed, pt educated on POC, HEP. Pt motivated to participate in PT and agreeable to attempt recommendations.    05/09/23: Bridges 2x10 Opp hand/knee ball press 2x10 Dead bugs 2x10  Squats 2x10 15# Lateral stepping red loop 4x30'(2 each way) Hip abduction 55# 2x10 - hip machine Cables 10# 2x10 palloffs   PATIENT EDUCATION:  Education details: [email or web address] Person educated: Patient Education method: Programmer, multimedia, Demonstration, Tactile cues, Verbal cues, and Handouts Education comprehension: verbalized understanding and returned demonstration  HOME EXERCISE PROGRAM: [email or web address]  ASSESSMENT:  CLINICAL IMPRESSION: Patient presents for treatment today, reports she has been doing well and feels like eval helped her better understand pelvic floor and now has better sensation of contraction/relaxation. Pt tolerated session well with focus on coordination of pelvic floor and exercises and breathing mechanics for improved pelvic floor strength and coordination.  Pt would benefit  from additional PT to further address deficits.     OBJECTIVE IMPAIRMENTS: decreased coordination, decreased endurance, decreased strength, increased fascial restrictions, impaired flexibility, improper body mechanics, and postural dysfunction.   ACTIVITY LIMITATIONS: continence  PARTICIPATION LIMITATIONS: community activity  PERSONAL FACTORS: Time since onset of injury/illness/exacerbation and 1 comorbidity: x2 vaginal births  are also affecting patient's functional outcome.   REHAB POTENTIAL: Good  CLINICAL DECISION MAKING:  Stable/uncomplicated  EVALUATION COMPLEXITY: Low   GOALS: Goals reviewed with patient? Yes  SHORT TERM GOALS: Target date: 03/16/23  Pt to be I with HEP.  Baseline: Goal status: MET  2.  Pt to demonstrate at least 3/5 pelvic floor strength and ability  hold for 8s for improved pelvic stability and decreased strain at pelvic floor. Baseline:  Goal status: on going  3.  Pt to be I with abdominal massage for improved stool type based on bristol stool scale to type 5 on average.  Baseline:  Goal status: on going   LONG TERM GOALS: Target date: 05/19/23  Pt to be I with advanced HEP.  Baseline:  Goal status: on going  2.  Pt to demonstrate at least 4/5 pelvic floor strength for improved pelvic stability and decreased strain at pelvic floor/ decrease leakage.  Baseline:  Goal status: on going  3.  Pt to report average urinary urge to ~every 3-4 hours for improved bladder habits.  Baseline:  Goal status: MET  4.  Pt to report ability to feel pelvic floor contractions at least 75% of the time with attempts.  Baseline:  Goal status: MET   PLAN:  PT FREQUENCY: every other week  PT DURATION:  6 sessions  PLANNED INTERVENTIONS: Therapeutic exercises, Therapeutic activity, Neuromuscular re-education, Patient/Family education, Self Care, Joint mobilization, Aquatic Therapy, Dry Needling, Electrical stimulation, Spinal mobilization, Cryotherapy, Moist heat, scar mobilization, Biofeedback, and Manual therapy  PLAN FOR NEXT SESSION: pelvic floor strengthening in various positions, coordination of breathing with pelvic floor and exercise, voiding mechanics.   Otelia Sergeant, PT, DPT 05/08/2409:22 AM

## 2023-05-20 ENCOUNTER — Other Ambulatory Visit (HOSPITAL_COMMUNITY): Payer: Self-pay

## 2023-05-23 ENCOUNTER — Other Ambulatory Visit (HOSPITAL_COMMUNITY): Payer: Self-pay

## 2023-05-23 ENCOUNTER — Ambulatory Visit: Payer: Medicare HMO | Admitting: Physical Therapy

## 2023-05-23 DIAGNOSIS — M6281 Muscle weakness (generalized): Secondary | ICD-10-CM | POA: Diagnosis not present

## 2023-05-23 DIAGNOSIS — R279 Unspecified lack of coordination: Secondary | ICD-10-CM

## 2023-05-23 NOTE — Therapy (Signed)
OUTPATIENT PHYSICAL THERAPY FEMALE PELVIC TREATMENT   Patient Name: Destiny Dunlap MRN: [id number] DOB:[birth year], 67 y.o., female Today's Date: 05/23/2023  END OF SESSION:  PT End of Session - 05/23/23 0935     Visit Number 3    Date for PT Re-Evaluation 07/09/23    Authorization Type [place] [employer] PPO    PT Start Time 0931    PT Stop Time 1013    PT Time Calculation (min) 42 min    Activity Tolerance Patient tolerated treatment well    Behavior During Therapy [facility] for tasks assessed/performed             Past Medical History:  Diagnosis Date   Arthritis    Burning sensation    Frequent headaches    Insomnia    Past Surgical History:  Procedure Laterality Date   APPENDECTOMY     Patient Active Problem List   Diagnosis Date Noted   Ocular migraine 01/01/2021   Posterior vitreous detachment of both eyes 05/07/2020   Pain 02/13/2020   body pain 02/13/2020   Paresthesia 01/08/2020    PCP: Oletha Blend, MD  REFERRING PROVIDER: Jerl Mina, MD  REFERRING DIAG: G62.9 (ICD-10-CM) - Polyneuropathy, unspecified  THERAPY DIAG:  Muscle weakness (generalized)  Unspecified lack of coordination  Rationale for Evaluation and Treatment: Rehabilitation  ONSET DATE: 2 years ago   SUBJECTIVE:                                                                                                                                                                                           SUBJECTIVE STATEMENT: Pt reports she has been doing HEP much better now, has better sensation of how/when she is doing them now. Can stop urine now.   PAIN:  Are you having pain? No   PRECAUTIONS: None  WEIGHT BEARING RESTRICTIONS: No  FALLS:  Has patient fallen in last 6 months? No  LIVING ENVIRONMENT: Lives with: lives with their family Lives in: House/apartment  OCCUPATION: OR nurse  PLOF: Independent  PATIENT GOALS: to make sure she is doing pelvic floor  contractions and have more awareness of pelvic floor.   PERTINENT HISTORY:  2 vaginal births, small fiber neuropathy  Sexual abuse: No  BOWEL MOVEMENT: Pain with bowel movement: No Type of bowel movement:Type (Bristol Stool Scale) 6-5, Frequency every other day, and Strain No Fully empty rectum: Yes:   Leakage: No Pads: No Fiber supplement: No  URINATION: Pain with urination: No Fully empty bladder: Yes:   Stream: Strong Urgency: No Frequency: several times in the morning but more than every 2 hours, no nighttime Leakage:  none  Pads: No  INTERCOURSE: Pain with intercourse:  not painful  Ability to have vaginal penetration:  Yes: was having a lot of dryness but had estrogen cream and improved a lot  Climax: not painful  Marinoff Scale: 0/3  PREGNANCY: Vaginal deliveries 2 Tearing Yes: minimal tearing with both  C-section deliveries 0 Currently pregnant No  PROLAPSE: None   OBJECTIVE:   DIAGNOSTIC FINDINGS:    COGNITION: Overall cognitive status: Within functional limits for tasks assessed     SENSATION: Light touch: Appears intact Proprioception: Appears intact  MUSCLE LENGTH: Bil hamstrings and adductors limited by 25%   POSTURE: rounded shoulders   LUMBARAROM/PROM:  A/PROM A/PROM  eval  Flexion WFL   Extension WFL  Right lateral flexion WFL  Left lateral flexion WFL  Right rotation WFL  Left rotation WFL   (Blank rows = not tested)  LOWER EXTREMITY ROM:  WFL  LOWER EXTREMITY MMT:  Bil hips grossly 4/5, knees 5/5  PALPATION:   General  mild tension at upper abdominal quadrants but no TTP                External Perineal Exam mild redness at vulva, mild tissue atrophy                              Internal Pelvic Floor no TTP, mild tightness  Patient confirms identification and approves PT to assess internal pelvic floor and treatment Yes  PELVIC MMT:   MMT eval  Vaginal 3/5, 3s, 4 reps  Internal Anal Sphincter   External Anal  Sphincter   Puborectalis   Diastasis Recti   (Blank rows = not tested)        TONE: WFL  PROLAPSE: Possible grade 1 anterior wall laxity with strong cough in hooklying   TODAY'S TREATMENT:                                                                                                                              DATE:   05/23/23:  Discussed continued HEP, updated HEP 2x10 palloffs blue band, x10 rotational palloffs blue band, x10 standing cross body crunch with opp knee march blue band anti rotation  Reviewed activating TA and pelvic floor with push/pull/lift with workouts and functional activities and pt verbalized understanding and did agree she has been able to consistently feel contraction at pelvic floor now.  Also educated on progression of pelvic floor contraction to standing for continued strengthening and pt verbalized understanding   PATIENT EDUCATION:  Education details: [email or web address] Person educated: Patient Education method: Explanation, Demonstration, Tactile cues, Verbal cues, and Handouts Education comprehension: verbalized understanding and returned demonstration  HOME EXERCISE PROGRAM: [email or web address]  ASSESSMENT:  CLINICAL IMPRESSION: Pt presents for treatment session - reports she has had improvement with symptoms. Agreeable with today being discharge from PT, states she is pleased with progress and able to continue HEP on her  own. Time spent on reviewed and updating HEP. Pt tolerated session well, pleased with progress. Pt understands she will need new referral for additional PT needs in the future. All goals met except pt deferred internal pelvic floor reassessment however pt reports improvement with all symptoms. Therefore these goals deferred. This will serve as DC from PT.    OBJECTIVE IMPAIRMENTS: decreased coordination, decreased endurance, decreased strength, increased fascial restrictions, impaired flexibility, improper body mechanics, and postural dysfunction.    ACTIVITY LIMITATIONS: continence  PARTICIPATION LIMITATIONS: community activity  PERSONAL FACTORS: Time since onset of injury/illness/exacerbation and 1 comorbidity: x2 vaginal births  are also affecting patient's functional outcome.   REHAB POTENTIAL: Good  CLINICAL DECISION MAKING: Stable/uncomplicated  EVALUATION COMPLEXITY: Low   GOALS: Goals reviewed with patient? Yes  SHORT TERM GOALS: Target date: 03/16/23  Pt to be I with HEP.  Baseline: Goal status: MET  2.  Pt to demonstrate at least 3/5 pelvic floor strength and ability  hold for 8s for improved pelvic stability and decreased strain at pelvic floor. Baseline:  Goal status: deferred - pt states her symptoms are better  3.  Pt to be I with abdominal massage for improved stool type based on bristol stool scale to type 5 on average.  Baseline:  Goal status: MET   LONG TERM GOALS: Target date: 05/19/23  Pt to be I with advanced HEP.  Baseline:  Goal status: MET  2.  Pt to demonstrate at least 4/5 pelvic floor strength for improved pelvic stability and decreased strain at pelvic floor/ decrease leakage.  Baseline:  Goal status: deferred   3.  Pt to report average urinary urge to ~every 3-4 hours for improved bladder habits.  Baseline:  Goal status: MET  4.  Pt to report ability to feel pelvic floor contractions at least 75% of the time with attempts.  Baseline:  Goal status: MET   PLAN:  PT FREQUENCY: every other week  PT DURATION:  6 sessions  PLANNED INTERVENTIONS: Therapeutic exercises, Therapeutic activity, Neuromuscular re-education, Patient/Family education, Self Care, Joint mobilization, Aquatic Therapy, Dry Needling, Electrical stimulation, Spinal mobilization, Cryotherapy, Moist heat, scar mobilization, Biofeedback, and Manual therapy  PLAN FOR NEXT SESSION:   PHYSICAL THERAPY DISCHARGE SUMMARY  Visits from Start of Care: 3  Current functional level related to goals / functional  outcomes: All goals met except internal pelvic floor strength reassessment. Pt reports improvement with symptoms   Remaining deficits: Pt states she does still have a little less sensation at pelvic floor but now able to stop urine fully when needed, and able to feel contractions much better now   Education / Equipment: HEP   Patient agrees to discharge. Patient goals were partially met. Patient is being discharged due to being pleased with the current functional level.   Otelia Sergeant, PT, DPT 05/22/2410:26 AM

## 2023-08-08 ENCOUNTER — Other Ambulatory Visit (HOSPITAL_COMMUNITY): Payer: Self-pay

## 2023-08-10 ENCOUNTER — Other Ambulatory Visit (HOSPITAL_COMMUNITY): Payer: Self-pay

## 2023-08-11 ENCOUNTER — Other Ambulatory Visit (HOSPITAL_COMMUNITY): Payer: Self-pay

## 2023-08-11 MED ORDER — GABAPENTIN 100 MG PO CAPS
300.0000 mg | ORAL_CAPSULE | Freq: Three times a day (TID) | ORAL | 11 refills | Status: AC
  Filled 2023-08-11 – 2023-09-07 (×4): qty 270, 30d supply, fill #0
  Filled 2023-10-09 – 2023-10-10 (×2): qty 270, 30d supply, fill #1
  Filled 2023-11-06 – 2023-12-05 (×2): qty 270, 30d supply, fill #2
  Filled 2024-01-23: qty 270, 30d supply, fill #3
  Filled 2024-01-23: qty 270, 30d supply, fill #0
  Filled 2024-04-18: qty 270, 30d supply, fill #1
  Filled 2024-05-22: qty 270, 30d supply, fill #2
  Filled 2024-07-16: qty 80, 9d supply, fill #3

## 2023-08-11 MED ORDER — GABAPENTIN 300 MG PO CAPS
300.0000 mg | ORAL_CAPSULE | Freq: Every day | ORAL | 11 refills | Status: DC
  Filled 2023-08-11 – 2023-09-07 (×4): qty 30, 30d supply, fill #0
  Filled 2023-10-09 – 2023-10-10 (×2): qty 30, 30d supply, fill #1
  Filled 2023-11-04 – 2023-12-07 (×3): qty 30, 30d supply, fill #2
  Filled 2024-01-09: qty 30, 30d supply, fill #3
  Filled 2024-01-23 – 2024-02-07 (×2): qty 30, 30d supply, fill #0
  Filled 2024-04-02: qty 30, 30d supply, fill #1
  Filled 2024-05-06: qty 30, 30d supply, fill #2
  Filled 2024-06-02: qty 30, 30d supply, fill #3
  Filled 2024-06-05: qty 12, 12d supply, fill #3
  Filled 2024-06-25 – 2024-06-27 (×2): qty 30, 30d supply, fill #4
  Filled 2024-07-05 – 2024-07-16 (×2): qty 30, 30d supply, fill #5

## 2023-08-11 MED ORDER — AMITRIPTYLINE HCL 10 MG PO TABS
20.0000 mg | ORAL_TABLET | Freq: Every day | ORAL | 3 refills | Status: DC
  Filled 2023-08-11 – 2023-11-04 (×3): qty 180, 90d supply, fill #0

## 2023-08-12 ENCOUNTER — Other Ambulatory Visit (HOSPITAL_COMMUNITY): Payer: Self-pay

## 2023-09-03 ENCOUNTER — Other Ambulatory Visit (HOSPITAL_COMMUNITY): Payer: Self-pay

## 2023-09-07 ENCOUNTER — Other Ambulatory Visit (HOSPITAL_COMMUNITY): Payer: Self-pay

## 2023-09-09 ENCOUNTER — Other Ambulatory Visit (HOSPITAL_COMMUNITY): Payer: Self-pay

## 2023-10-10 ENCOUNTER — Other Ambulatory Visit (HOSPITAL_COMMUNITY): Payer: Self-pay

## 2023-10-10 ENCOUNTER — Other Ambulatory Visit: Payer: Self-pay

## 2023-10-10 MED ORDER — INFLUENZA VIRUS VACC SPLIT PF (FLUZONE) 0.5 ML IM SUSY
0.5000 mL | PREFILLED_SYRINGE | Freq: Once | INTRAMUSCULAR | 0 refills | Status: DC
  Filled 2023-10-10 (×2): qty 0.5, 1d supply, fill #0

## 2023-10-10 MED ORDER — INFLUENZA VAC A&B SURF ANT ADJ 0.5 ML IM SUSY
0.5000 mL | PREFILLED_SYRINGE | Freq: Once | INTRAMUSCULAR | 0 refills | Status: AC
  Filled 2023-10-10: qty 0.5, 1d supply, fill #0

## 2023-10-11 ENCOUNTER — Other Ambulatory Visit (HOSPITAL_COMMUNITY): Payer: Self-pay

## 2023-10-19 ENCOUNTER — Other Ambulatory Visit (HOSPITAL_COMMUNITY): Payer: Self-pay

## 2023-10-24 ENCOUNTER — Other Ambulatory Visit (HOSPITAL_COMMUNITY): Payer: Self-pay

## 2023-10-31 ENCOUNTER — Other Ambulatory Visit (HOSPITAL_COMMUNITY): Payer: Self-pay

## 2023-10-31 MED ORDER — AZITHROMYCIN 250 MG PO TABS
ORAL_TABLET | ORAL | 0 refills | Status: AC
  Filled 2023-10-31: qty 6, 5d supply, fill #0

## 2023-10-31 MED ORDER — PREDNISONE 10 MG PO TABS
10.0000 mg | ORAL_TABLET | Freq: Every day | ORAL | 0 refills | Status: AC
  Filled 2023-10-31: qty 7, 7d supply, fill #0

## 2023-11-01 ENCOUNTER — Other Ambulatory Visit (HOSPITAL_COMMUNITY): Payer: Self-pay

## 2023-11-05 ENCOUNTER — Other Ambulatory Visit (HOSPITAL_COMMUNITY): Payer: Self-pay

## 2023-11-05 ENCOUNTER — Encounter (HOSPITAL_COMMUNITY): Payer: Self-pay

## 2023-11-07 ENCOUNTER — Other Ambulatory Visit (HOSPITAL_COMMUNITY): Payer: Self-pay

## 2023-12-05 ENCOUNTER — Other Ambulatory Visit (HOSPITAL_COMMUNITY): Payer: Self-pay

## 2023-12-08 ENCOUNTER — Other Ambulatory Visit (HOSPITAL_COMMUNITY): Payer: Self-pay

## 2023-12-14 ENCOUNTER — Other Ambulatory Visit (HOSPITAL_COMMUNITY): Payer: Self-pay

## 2024-01-09 ENCOUNTER — Other Ambulatory Visit (HOSPITAL_COMMUNITY): Payer: Self-pay

## 2024-01-23 ENCOUNTER — Other Ambulatory Visit (HOSPITAL_COMMUNITY): Payer: Self-pay

## 2024-01-23 ENCOUNTER — Other Ambulatory Visit (HOSPITAL_BASED_OUTPATIENT_CLINIC_OR_DEPARTMENT_OTHER): Payer: Self-pay

## 2024-01-24 ENCOUNTER — Other Ambulatory Visit (HOSPITAL_BASED_OUTPATIENT_CLINIC_OR_DEPARTMENT_OTHER): Payer: Self-pay

## 2024-01-30 ENCOUNTER — Other Ambulatory Visit (HOSPITAL_BASED_OUTPATIENT_CLINIC_OR_DEPARTMENT_OTHER): Payer: Self-pay

## 2024-01-30 MED ORDER — HYDROCORTISONE 2.5 % EX OINT
TOPICAL_OINTMENT | CUTANEOUS | 0 refills | Status: AC
  Filled 2024-01-30: qty 28.35, 30d supply, fill #0

## 2024-02-07 ENCOUNTER — Other Ambulatory Visit (HOSPITAL_BASED_OUTPATIENT_CLINIC_OR_DEPARTMENT_OTHER): Payer: Self-pay

## 2024-02-07 ENCOUNTER — Other Ambulatory Visit (HOSPITAL_COMMUNITY): Payer: Self-pay

## 2024-04-02 ENCOUNTER — Other Ambulatory Visit (HOSPITAL_BASED_OUTPATIENT_CLINIC_OR_DEPARTMENT_OTHER): Payer: Self-pay

## 2024-04-18 ENCOUNTER — Other Ambulatory Visit (HOSPITAL_BASED_OUTPATIENT_CLINIC_OR_DEPARTMENT_OTHER): Payer: Self-pay

## 2024-04-24 ENCOUNTER — Other Ambulatory Visit (HOSPITAL_BASED_OUTPATIENT_CLINIC_OR_DEPARTMENT_OTHER): Payer: Self-pay | Admitting: Family Medicine

## 2024-04-24 DIAGNOSIS — Z1231 Encounter for screening mammogram for malignant neoplasm of breast: Secondary | ICD-10-CM

## 2024-04-24 DIAGNOSIS — Z13820 Encounter for screening for osteoporosis: Secondary | ICD-10-CM

## 2024-05-06 ENCOUNTER — Other Ambulatory Visit (HOSPITAL_BASED_OUTPATIENT_CLINIC_OR_DEPARTMENT_OTHER): Payer: Self-pay

## 2024-05-07 ENCOUNTER — Ambulatory Visit (HOSPITAL_BASED_OUTPATIENT_CLINIC_OR_DEPARTMENT_OTHER)

## 2024-05-07 ENCOUNTER — Other Ambulatory Visit (HOSPITAL_BASED_OUTPATIENT_CLINIC_OR_DEPARTMENT_OTHER): Payer: Self-pay

## 2024-05-08 ENCOUNTER — Other Ambulatory Visit (HOSPITAL_BASED_OUTPATIENT_CLINIC_OR_DEPARTMENT_OTHER)

## 2024-05-14 ENCOUNTER — Ambulatory Visit (HOSPITAL_BASED_OUTPATIENT_CLINIC_OR_DEPARTMENT_OTHER)
Admission: RE | Admit: 2024-05-14 | Discharge: 2024-05-14 | Disposition: A | Discharge status: Home / Self Care | Source: Ambulatory Visit | Attending: Family Medicine | Admitting: Family Medicine

## 2024-05-14 ENCOUNTER — Encounter (HOSPITAL_BASED_OUTPATIENT_CLINIC_OR_DEPARTMENT_OTHER): Payer: Self-pay

## 2024-05-14 DIAGNOSIS — Z780 Asymptomatic menopausal state: Secondary | ICD-10-CM | POA: Insufficient documentation

## 2024-05-14 DIAGNOSIS — Z13820 Encounter for screening for osteoporosis: Secondary | ICD-10-CM

## 2024-05-14 DIAGNOSIS — Z1231 Encounter for screening mammogram for malignant neoplasm of breast: Secondary | ICD-10-CM | POA: Insufficient documentation

## 2024-05-14 DIAGNOSIS — M8589 Other specified disorders of bone density and structure, multiple sites: Secondary | ICD-10-CM | POA: Insufficient documentation

## 2024-05-22 ENCOUNTER — Other Ambulatory Visit (HOSPITAL_BASED_OUTPATIENT_CLINIC_OR_DEPARTMENT_OTHER): Payer: Self-pay

## 2024-06-04 ENCOUNTER — Other Ambulatory Visit (HOSPITAL_BASED_OUTPATIENT_CLINIC_OR_DEPARTMENT_OTHER): Payer: Self-pay

## 2024-06-05 ENCOUNTER — Other Ambulatory Visit (HOSPITAL_BASED_OUTPATIENT_CLINIC_OR_DEPARTMENT_OTHER): Payer: Self-pay

## 2024-06-25 ENCOUNTER — Other Ambulatory Visit (HOSPITAL_BASED_OUTPATIENT_CLINIC_OR_DEPARTMENT_OTHER): Payer: Self-pay

## 2024-06-26 ENCOUNTER — Other Ambulatory Visit (HOSPITAL_BASED_OUTPATIENT_CLINIC_OR_DEPARTMENT_OTHER): Payer: Self-pay

## 2024-06-26 MED ORDER — FLUZONE HIGH-DOSE 0.5 ML IM SUSY
0.5000 mL | PREFILLED_SYRINGE | Freq: Once | INTRAMUSCULAR | 0 refills | Status: AC
  Filled 2024-06-26: qty 0.5, 1d supply, fill #0

## 2024-06-27 ENCOUNTER — Other Ambulatory Visit (HOSPITAL_BASED_OUTPATIENT_CLINIC_OR_DEPARTMENT_OTHER): Payer: Self-pay

## 2024-07-05 ENCOUNTER — Other Ambulatory Visit (HOSPITAL_BASED_OUTPATIENT_CLINIC_OR_DEPARTMENT_OTHER): Payer: Self-pay

## 2024-07-16 ENCOUNTER — Other Ambulatory Visit: Payer: Self-pay

## 2024-07-16 ENCOUNTER — Other Ambulatory Visit (HOSPITAL_BASED_OUTPATIENT_CLINIC_OR_DEPARTMENT_OTHER): Payer: Self-pay

## 2024-07-17 ENCOUNTER — Other Ambulatory Visit (HOSPITAL_BASED_OUTPATIENT_CLINIC_OR_DEPARTMENT_OTHER): Payer: Self-pay

## 2024-07-17 MED ORDER — GABAPENTIN 300 MG PO CAPS
300.0000 mg | ORAL_CAPSULE | Freq: Every day | ORAL | 11 refills | Status: DC
Start: 2024-07-17 — End: 2024-08-21
  Filled 2024-07-17 – 2024-07-25 (×3): qty 30, 30d supply, fill #0

## 2024-07-20 ENCOUNTER — Other Ambulatory Visit (HOSPITAL_BASED_OUTPATIENT_CLINIC_OR_DEPARTMENT_OTHER): Payer: Self-pay

## 2024-07-24 ENCOUNTER — Other Ambulatory Visit (HOSPITAL_BASED_OUTPATIENT_CLINIC_OR_DEPARTMENT_OTHER): Payer: Self-pay

## 2024-07-25 ENCOUNTER — Other Ambulatory Visit (HOSPITAL_BASED_OUTPATIENT_CLINIC_OR_DEPARTMENT_OTHER): Payer: Self-pay

## 2024-07-30 ENCOUNTER — Encounter (HOSPITAL_COMMUNITY): Payer: Self-pay

## 2024-07-30 ENCOUNTER — Other Ambulatory Visit (HOSPITAL_COMMUNITY): Payer: Self-pay

## 2024-07-30 ENCOUNTER — Other Ambulatory Visit (HOSPITAL_BASED_OUTPATIENT_CLINIC_OR_DEPARTMENT_OTHER): Payer: Self-pay

## 2024-07-30 MED ORDER — GABAPENTIN 300 MG PO CAPS
300.0000 mg | ORAL_CAPSULE | Freq: Four times a day (QID) | ORAL | 11 refills | Status: AC
  Filled 2024-07-30 (×2): qty 120, 30d supply, fill #0
  Filled 2024-08-28: qty 120, 30d supply, fill #1
  Filled 2024-09-29: qty 120, 30d supply, fill #2
  Filled 2024-10-27: qty 120, 30d supply, fill #3
  Filled ????-??-??: fill #0

## 2024-07-31 ENCOUNTER — Other Ambulatory Visit (HOSPITAL_BASED_OUTPATIENT_CLINIC_OR_DEPARTMENT_OTHER): Payer: Self-pay

## 2024-08-21 ENCOUNTER — Other Ambulatory Visit (HOSPITAL_BASED_OUTPATIENT_CLINIC_OR_DEPARTMENT_OTHER): Payer: Self-pay

## 2024-08-28 ENCOUNTER — Other Ambulatory Visit (HOSPITAL_BASED_OUTPATIENT_CLINIC_OR_DEPARTMENT_OTHER): Payer: Self-pay

## 2024-09-29 ENCOUNTER — Other Ambulatory Visit (HOSPITAL_BASED_OUTPATIENT_CLINIC_OR_DEPARTMENT_OTHER): Payer: Self-pay

## 2024-10-01 ENCOUNTER — Other Ambulatory Visit (HOSPITAL_BASED_OUTPATIENT_CLINIC_OR_DEPARTMENT_OTHER): Payer: Self-pay

## 2024-10-15 ENCOUNTER — Other Ambulatory Visit (HOSPITAL_BASED_OUTPATIENT_CLINIC_OR_DEPARTMENT_OTHER): Payer: Self-pay

## 2024-10-16 ENCOUNTER — Other Ambulatory Visit (HOSPITAL_BASED_OUTPATIENT_CLINIC_OR_DEPARTMENT_OTHER): Payer: Self-pay

## 2024-10-17 ENCOUNTER — Other Ambulatory Visit (HOSPITAL_BASED_OUTPATIENT_CLINIC_OR_DEPARTMENT_OTHER): Payer: Self-pay

## 2024-10-17 MED ORDER — AMITRIPTYLINE HCL 10 MG PO TABS
20.0000 mg | ORAL_TABLET | Freq: Every day | ORAL | 0 refills | Status: DC
  Filled 2024-10-17: qty 10, 5d supply, fill #0

## 2024-10-29 ENCOUNTER — Other Ambulatory Visit (HOSPITAL_BASED_OUTPATIENT_CLINIC_OR_DEPARTMENT_OTHER): Payer: Self-pay
